# Patient Record
Sex: Male | Born: 1960
Health system: Southern US, Community
[De-identification: ages and names within clinical notes are randomized; demographics above are authoritative.]

## PROBLEM LIST (undated history)

## (undated) DIAGNOSIS — I10 Essential (primary) hypertension: Secondary | ICD-10-CM

## (undated) DIAGNOSIS — IMO0002 Reserved for concepts with insufficient information to code with codable children: Secondary | ICD-10-CM

## (undated) DIAGNOSIS — Z8719 Personal history of other diseases of the digestive system: Secondary | ICD-10-CM

## (undated) DIAGNOSIS — E119 Type 2 diabetes mellitus without complications: Secondary | ICD-10-CM

## (undated) DIAGNOSIS — K5900 Constipation, unspecified: Secondary | ICD-10-CM

## (undated) DIAGNOSIS — M549 Dorsalgia, unspecified: Secondary | ICD-10-CM

## (undated) DIAGNOSIS — J189 Pneumonia, unspecified organism: Secondary | ICD-10-CM

## (undated) DIAGNOSIS — M778 Other enthesopathies, not elsewhere classified: Secondary | ICD-10-CM

## (undated) DIAGNOSIS — M779 Enthesopathy, unspecified: Secondary | ICD-10-CM

## (undated) DIAGNOSIS — E785 Hyperlipidemia, unspecified: Secondary | ICD-10-CM

## (undated) HISTORY — PX: CARPAL TUNNEL RELEASE: SHX101

## (undated) HISTORY — PX: HERNIA REPAIR: SHX51

## (undated) HISTORY — DX: Hyperlipidemia, unspecified: E78.5

## (undated) HISTORY — PX: UMBILICAL HERNIA REPAIR: SHX196

## (undated) HISTORY — PX: COLONOSCOPY: SHX174

---

## 1999-08-30 ENCOUNTER — Encounter: Payer: Self-pay | Admitting: Orthopedic Surgery

## 1999-08-30 ENCOUNTER — Encounter: Admission: RE | Admit: 1999-08-30 | Discharge: 1999-08-30 | Payer: Self-pay | Admitting: Orthopedic Surgery

## 1999-09-03 ENCOUNTER — Ambulatory Visit (HOSPITAL_BASED_OUTPATIENT_CLINIC_OR_DEPARTMENT_OTHER): Admission: RE | Admit: 1999-09-03 | Discharge: 1999-09-03 | Payer: Self-pay | Admitting: Orthopedic Surgery

## 1999-09-17 ENCOUNTER — Ambulatory Visit (HOSPITAL_BASED_OUTPATIENT_CLINIC_OR_DEPARTMENT_OTHER): Admission: RE | Admit: 1999-09-17 | Discharge: 1999-09-17 | Payer: Self-pay | Admitting: Orthopedic Surgery

## 2000-06-05 ENCOUNTER — Encounter: Payer: Self-pay | Admitting: Otolaryngology

## 2000-06-05 ENCOUNTER — Encounter: Admission: RE | Admit: 2000-06-05 | Discharge: 2000-06-05 | Payer: Self-pay | Admitting: Otolaryngology

## 2000-09-19 ENCOUNTER — Encounter: Payer: Self-pay | Admitting: Emergency Medicine

## 2000-09-19 ENCOUNTER — Emergency Department (HOSPITAL_COMMUNITY): Admission: EM | Admit: 2000-09-19 | Discharge: 2000-09-19 | Payer: Self-pay | Admitting: Emergency Medicine

## 2002-10-31 ENCOUNTER — Ambulatory Visit (HOSPITAL_COMMUNITY): Admission: RE | Admit: 2002-10-31 | Discharge: 2002-10-31 | Payer: Self-pay | Admitting: Gastroenterology

## 2003-11-14 ENCOUNTER — Emergency Department (HOSPITAL_COMMUNITY): Admission: EM | Admit: 2003-11-14 | Discharge: 2003-11-14 | Payer: Self-pay | Admitting: Emergency Medicine

## 2009-01-05 ENCOUNTER — Observation Stay (HOSPITAL_COMMUNITY): Admission: EM | Admit: 2009-01-05 | Discharge: 2009-01-07 | Payer: Self-pay | Admitting: Emergency Medicine

## 2010-03-19 ENCOUNTER — Encounter: Admission: RE | Admit: 2010-03-19 | Discharge: 2010-03-19 | Payer: Self-pay | Admitting: Internal Medicine

## 2010-11-11 LAB — LIPID PANEL
Cholesterol: 124 mg/dL (ref 0–200)
LDL Cholesterol: 71 mg/dL (ref 0–99)
Total CHOL/HDL Ratio: 3.3 RATIO
Triglycerides: 73 mg/dL (ref <150)
VLDL: 15 mg/dL (ref 0–40)

## 2010-11-11 LAB — CBC
HCT: 40.8 % (ref 39.0–52.0)
Hemoglobin: 13.7 g/dL (ref 13.0–17.0)
MCHC: 33.4 g/dL (ref 30.0–36.0)
MCHC: 33.7 g/dL (ref 30.0–36.0)
Platelets: 217 10*3/uL (ref 150–400)
RBC: 4.75 MIL/uL (ref 4.22–5.81)
RBC: 4.98 MIL/uL (ref 4.22–5.81)
RDW: 14 % (ref 11.5–15.5)
RDW: 14.4 % (ref 11.5–15.5)

## 2010-11-11 LAB — CK TOTAL AND CKMB (NOT AT ARMC)
CK, MB: 0.8 ng/mL (ref 0.3–4.0)
CK, MB: 0.8 ng/mL (ref 0.3–4.0)
Relative Index: INVALID (ref 0.0–2.5)
Relative Index: INVALID (ref 0.0–2.5)
Total CK: 62 U/L (ref 7–232)
Total CK: 84 U/L (ref 7–232)
Total CK: 99 U/L (ref 7–232)

## 2010-11-11 LAB — HEMOGLOBIN A1C
Hgb A1c MFr Bld: 6.1 % (ref 4.6–6.1)
Mean Plasma Glucose: 128 mg/dL

## 2010-11-11 LAB — BASIC METABOLIC PANEL
BUN: 17 mg/dL (ref 6–23)
CO2: 24 mEq/L (ref 19–32)
CO2: 26 mEq/L (ref 19–32)
Calcium: 9.2 mg/dL (ref 8.4–10.5)
Creatinine, Ser: 1.06 mg/dL (ref 0.4–1.5)
GFR calc Af Amer: >60 mL/min (ref >60)
GFR calc Af Amer: >60 mL/min (ref >60)
Glucose, Bld: 114 mg/dL — ABNORMAL HIGH (ref 70–99)
Potassium: 3.9 mEq/L (ref 3.5–5.1)
Sodium: 135 mEq/L (ref 135–145)

## 2010-11-11 LAB — DIFFERENTIAL
Basophils Absolute: 0 10*3/uL (ref 0.0–0.1)
Basophils Relative: 0 % (ref 0–1)
Eosinophils Absolute: 0 10*3/uL (ref 0.0–0.7)
Eosinophils Relative: 0 % (ref 0–5)
Monocytes Absolute: 0.3 10*3/uL (ref 0.1–1.0)
Neutro Abs: 7.7 10*3/uL (ref 1.7–7.7)

## 2010-11-11 LAB — BRAIN NATRIURETIC PEPTIDE: Pro B Natriuretic peptide (BNP): <30 pg/mL (ref 0.0–100.0)

## 2010-11-11 LAB — POCT CARDIAC MARKERS: Troponin i, poc: <0.05 ng/mL (ref 0.00–0.09)

## 2010-12-17 NOTE — Discharge Summary (Signed)
NAMEHOSIE, Matthew Sawyer              ACCOUNT NO.:  [id number]   MEDICAL RECORD NO.:  [id number]          PATIENT TYPE:  INP   LOCATION:  3705                         FACILITY:  [facility]   PHYSICIAN:  Charlestine Massed, MDDATE OF BIRTH:  [birth year]   DATE OF ADMISSION:  01/05/2009  DATE OF DISCHARGE:  01/07/2009                               DISCHARGE SUMMARY   PRIMARY CARE PHYSICIAN:  Tammy R. Collins Scotland, MD   CARDIOLOGY:  Cristy Hilts. Jacinto Halim, MD   GASTROENTEROLOGY:  Barbette Hair. Arlyce Dice, MD, [facility]   REASON FOR ADMISSION:  Tightness in the chest.   DISCHARGE DIAGNOSES:  1. Chest pain, noncardiac origin, possibly secondary to hiatal hernia.  2. Metabolic syndrome with 3/5 for metabolic syndrome being increased      waist circumference/ obesity, increased blood pressure, increased      fasting blood glucose more than 110.  3. Impaired glucose tolerance  4. Hypertension.  5. Obesity.  6. Hiatal hernia with possible gastroesophageal reflux disease.   DISCHARGE MEDICATIONS:  1. Lotrel 5/20 one tablet p.o. daily.  2. Aspirin 81 mg p.o. daily.  3. Prilosec 20 mg p.o. daily.  4. Metformin 500 mg p.o. b.i.d.   HOSPITAL COURSE:  1. Chest tightness.  The patient was admitted on January 05, 2009 for      complaints of chest tightness.  He said that suddenly around 11:30      p.m. in the night when he was about to sleep he felt a chest      tightness and feeling of indigestion and he was belching much at      that time and he said that there was lot of gas coming through his      upper gastric tract through the mouth.  There was mild pleuritic      component to chest pain and the patient stated that whenever he      took a deep breath he felt some pain also which was increasing.      The pain was more up with the sternal and all over the mediastinal      area with no specific area the patient could point with two fingers      and no fevers, no chills,  no cough, no sputum production, no      nausea,  vomiting, diarrhea.  He is not a smoker, very occasionally      social alcohol intake.  The patient stated that 3 weeks ago he      traveled from [place] to [place] on the flight which is almost 2      hours.  Other than that there is no sedentary lifestyle for him.      He works in the [employer] and he is otherwise in good health.      He goes to gym daily which he recently restarted and he runs on the      treadmill at more than 5 miles an hour for more than half an hour      and has perfectly good exercise tolerance.  No palpitations, no  orthopnea, no  paroxysmal nocturnal dyspnea. The patient was      admitted to telemetry.  His chest x-ray revealed there was a small      shadow in the lingula which possibly could be pneumonia and so he      was started on antibiotic with the admitting physician.  The      patient was examined on the floor.  He does not have any cough,      fever, sputum production or any signs or symptoms of any pneumonia      or bronchitis and there was no sick contacts.  Also in view of the      fact that the patient had a sudden onset chest pain and the shadow      was stated as either pneumonia versus could be a mass and also      pending diagnosis of any possible pulmonary embolism, we decided to      get a CAT scan.  CAT scan of the chest ruled out the presence of      any mass in the chest, it ruled out a pulmonary embolism and it      ruled out pneumonia also, so his antibiotics were discontinued.  He      does not have clinical pneumonia and he does not have pneumonia on      the CT scan also.  He was taken off antibiotics.  The CT scan also      reported presence of hiatal hernia and some secondary thickening of      the lower border of the esophagus.  This was in the fact that the      patient may be having chronic gastroesophageal reflux due to hiatal      hernia and this needs a Gastroenterology evaluation as outpatient      to rule out a  Barrett's or any other features in the esophagus.      This could have contributed for his chest tightness which he      experienced.  The chest tightness even though it was present for      more than 3-4 hours did not result in significant EKG changes.  The      EKG was showing right bundle branch block with no evidence of any      new acute ST or T-wave changes.  The patient could have had thi      right bundle branch block for a long time.  In view of the current      exam that ruled him out for PE also which is also negative. The      patient has been advised to continue exercise.  He has been      diagnosed with metabolic syndrome on this admission.  He will be      seen Dr. Arlyce Dice of [facility] as an outpatient for      EGD to evaluate his gastroesophageal reflux and hiatal hernia.  He      will also see Dr. Jacinto Halim at [facility] as an      outpatient evaluation and echocardiogram and if needed any stress      test at the discretion of Dr. Jacinto Halim.  The patient is being      discharged today.  Telemetry did not reveal any abnormal arrhythmic      events.  2. Metabolic syndrome.  The patient has increased waist circumference.  He is obese.  His elevated fasting blood glucose is 114 and he has      hypertension which fits into a diagnosis of metabolic syndrome.      The patient has been advised diet and exercise.  We have started      him on metformin in view of the fact that he may be in the process      of helping diabetes and the metformin can help him with his insulin      resistance in this case.  He will be seeing his primary doctor in      about 1-2 weeks and needs a further checkup of hemoglobin A1c in      another 3 months.  His current hemoglobin A1c is 6.1.  3. His hypertension currently is controlled.  We will continue Lotrel      5/20 one tablet daily.  4. Hiatal hernia/gastroesophageal reflux.  We will continue Prilosec      20 mg p.o.  daily and to see Dr. Arlyce Dice as an outpatient.   DISPOSITION:  Discharged back home.   FOLLOWUP:  1. Follow up with Dr. Collins Scotland in 2-3 weeks.  2. Follow up or call [facility] at [phone] for      appointment with Dr. Jacinto Halim for echocardiogram and for possible      stress test and consult.  3. Call [facility] [phone] to see Dr. Arlyce Dice for      evaluation of hiatal hernia and GERD.  Possibly EGD needed at that      time.   DISCHARGE INSTRUCTIONS:  1. Keep up with regular exercise and diet control and lose weight.      Follow a low-sodium, heart-healthy and diabetic diet.  2. Adhere to the appointment as mentioned.  3. A total of 45 minutes was spent on discharge and educating the      patient about his risks and explaining the appointments and      procedures.      Charlestine Massed, MD  Electronically Signed     UT/MEDQ  D:  01/07/2009  T:  01/08/2009  Job:  [id number]   cc:   Barbette Hair. Arlyce Dice, MD,FACG  Cristy Hilts. Jacinto Halim, MD  Tammy R. Collins Scotland, M.D.

## 2010-12-17 NOTE — H&P (Signed)
Matthew Sawyer, Matthew Sawyer              ACCOUNT NO.:  [id number]   MEDICAL RECORD NO.:  [id number]          PATIENT TYPE:  INP   LOCATION:  1824                         FACILITY:  [facility]   PHYSICIAN:  Richarda Overlie, MD       DATE OF BIRTH:  06/11/1961   DATE OF ADMISSION:  01/05/2009  DATE OF DISCHARGE:                              HISTORY & PHYSICAL   PRIMARY CARE PHYSICIAN:  Dr. Collins Scotland.   SUBJECTIVE:  This is a 50 year old male who presents to the ER with the  chief complaint of chest tightness and feeling of indigestion that  started around 11:30 last night.  It continued until 2:30.  The patient  states that he found relief after taking 4 baby aspirin.  The patient's  chest tightness is not particularly related to exertion.  However, it  was present when the patient takes a deep breath, and is pleuritc in  nature .  He denies any cough, fevers, chills and rigors that he knows  of.  Denies any sick contacts.  He is a nonsmoker.  He denies any  shortness of breath, palpitations, dizziness, orthopnea, paroxysmal  nocturnal dyspnea, or peripheral edema.  The patient does not have any  history of cardiac disease although he has history of long-standing  history and borderline diabetes.  He has been otherwise in fairly good  health.   PAST MEDICAL HISTORY:  1. Hypertension.  2. Borderline diabetes.   PAST SURGICAL HISTORY:  None.   SOCIAL HISTORY:  Nonsmoker.  Drinks socially.  Works in the [employer].   FAMILY HISTORY:  Mother has congestive heart failure.  Father died of a  massive heart attack in his late 17s.   ALLERGIES:  No known drug allergies.   MEDICATIONS:  Lotrel 5/20 one tablet p.o. daily.   PHYSICAL EXAMINATION:  VITAL SIGNS:  Blood pressure 131/70, pulse 107,  respirations 18, temperature 98.5.  GENERAL:  The patient appears to be currently comfortable in no acute  distress, well developed, well nourished.  HEENT:  Pupils equal and reactive.  Extraocular movements  intact.  CARDIOVASCULAR:  Regular rate and rhythm.  No appreciable murmurs, rubs  or gallops.  ABDOMEN:  Soft, nontender, nondistended.  EXTREMITIES:  Without clubbing, cyanosis, or edema.  NEUROLOGICAL:  Cranial nerves II-XII grossly intact.  LUNGS:  Bibasilar rhonchi and occasional wheezing at the bases with  crackles at the left base  PSYCHIATRIC:  Appropriate mood and affect.   LABORATORY DATA:  Sinus tachycardia with left axis deviation and long QT  interval.   Other labs:  Chest x-ray showed streaky left lingular opacity, otherwise  no acute cardiopulmonary disease.  Troponin less than 0.02, CK-MB 1.6.  BMET:  Sodium 130, potassium 3.9, chloride 96, bicarb 24, glucose 185,  BUN 17, creatinine 1.06.  CBC:  WBC is 8.4, hemoglobin 14.4, hematocrit  43.2, platelet count 217.   ASSESSMENT:  1. Chest tightness, probably secondary to pneumonia.  2. Hyponatremia secondary to syndrome of inappropriate antidiuretic      hormone secretion.  3. Mild renal insufficiency.  4. Sinus tachycardia secondary to underlying infection.  5. Hypertension.   PLAN:  Patient will be admitted to a telemetry unit with serial  troponins and EKGs to be ruled out for acute coronary syndrome.  He has  a negative D-dimer.  His chest x-ray is positive for a left lingular  opacity which could be a pneumonia.  The patient will be started  empirically on ceftriaxone, azithromycin, breathing treatments with  Atrovent and Pulmicort nebulizers.  Will run cardiac enzymes, BNP,  fasting lipid panel, hemoglobin A1c for cardiac risk stratification.  The D-dimer was done and was found to be negative; therefore, low  suspicion for pulmonary embolism.  Most likely, this patient has a  pneumonia.  The patient will be started on normal saline for IV  hydration.  Hyponatremia could be related to dehydration which is also  suggested by the patient's sinus tachycardia.  Once the patient is  hydrated and the patient is  symptomatically improved, the patient can be  discharged in the morning.      Richarda Overlie, MD  Electronically Signed     NA/MEDQ  D:  01/05/2009  T:  01/05/2009  Job:  [id number]

## 2010-12-20 NOTE — Op Note (Signed)
Matthew Sawyer, Matthew Sawyer                          ACCOUNT NO.:  [id number]   MEDICAL RECORD NO.:  [id number]                   PATIENT TYPE:  AMB   LOCATION:  ENDO                                 FACILITY:  [facility]   PHYSICIAN:  Anselmo Rod, M.D.               DATE OF BIRTH:  17-Apr-1961   DATE OF PROCEDURE:  10/31/2002  DATE OF DISCHARGE:                                 OPERATIVE REPORT   PROCEDURE PERFORMED:  Colonoscopy.   ENDOSCOPIST:  Charna Elizabeth, M.D.   INSTRUMENT USED:  Olympus video colonoscope.   INDICATIONS FOR PROCEDURE:  Rectal bleeding in a 50 year old [place]-  American male.  Rule out colonic polyps, masses, hemorrhoids, etc.   PREPROCEDURE PREPARATION:  Informed consent was procured from the patient.  The patient was fasted for eight hours prior to the procedure and prepped  with a bottle of magnesium citrate and a gallon of GoLYTELY the night prior  to the procedure.   PREPROCEDURE PHYSICAL:  The patient had stable vital signs.  Neck supple.  Chest clear to auscultation.  S1 and S2 regular.  Abdomen soft with normal  bowel sounds.   DESCRIPTION OF PROCEDURE:  The patient was placed in left lateral decubitus  position and sedated with 75 mg of Demerol and 7.5 mg of Versed  intravenously.  Once the patient was adequately sedated and maintained on  low flow oxygen and continuous cardiac monitoring, the Olympus video  colonoscope was advanced from the rectum to the cecum with difficulty.  There was a large amount of residual stool in the colon.  Multiple washes  were done.  The patient's position was changed from the left lateral to the  supine and right lateral position to reach the cecal base.  The appendicular  orifice and ileocecal valve were visualized and photographed.  No masses,  polyps, erosions, ulcerations or diverticula were seen.  Retroflexion in the  rectum revealed prominent internal hemorrhoids.  The patient tolerated the  procedure well without  complications.  Considering the significant amount of  residual stool in the colon, small lesions could have been missed.   IMPRESSION:  1. A large amount of residual stool in the colon, small lesions could have     been missed.  2. Prominent internal hemorrhoids seen on retroflexion.              RECOMMENDATIONS:  1. A high fiber diet with liberal fluid intake has been advocated.  2. Steroid suppositories will be tried if the patient's symptoms persisted.  3. Repeat colorectal cancer screening as recommended at age 18 unless the     patient develops any abnormal symptoms in the interim.  Anselmo Rod, M.D.    JNM/MEDQ  D:  10/31/2002  T:  10/31/2002  Job:  [id number]   cc:   Candyce Churn. Allyne Gee, M.D.  [address]  [address]  [place]  [place] [address]  Fax: [phone]

## 2010-12-20 NOTE — Op Note (Signed)
[place]  Patient:    [name], [name]                       MRN: [id number] Proc. Date: 09/17/99 Adm. Date:  [id number] Attending:  Sypher, Douglass Rivers CC:         Katy Fitch. Sypher, Montez Hageman., M.D. x 2                           Operative Report  PREOPERATIVE DIAGNOSIS:  Entrapment neuropathy median nerve left carpal tunnel.  POSTOPERATIVE DIAGNOSIS:  Entrapment neuropathy median nerve left carpal tunnel.  PROCEDURE:  Release of left transverse carpal ligament.  SURGEON:  Katy Fitch. Sypher, Montez Hageman., M.D.  ASSISTANT:  Matthew Sawyer, P.A.  ANESTHESIA:  General LMA.  SUPERVISING ANESTHESIOLOGIST:  Dr. Katrinka Blazing.  INDICATIONS:  [name] is a 50 year old employee of the [employer]. Since 1994, he has experienced discomfort in his hands consistent with entrapment neuropathy of the median nerves.  He has had electrodiagnostic studies that confirms severe bilateral carpal tunnel syndrome.  He is status post right carpal tunnel release.  He requests that an identical left carpal tunnel release be performed at this time.  DESCRIPTION OF PROCEDURE:  [name] was brought to the operating room and  placed supine position on the operating table.  Following induction of general anesthesia by LMA, the arm was prepped with Betadine soap solution and sterilely draped.  Following exsanguination of limb with Esmarch bandage, arterial tourniquet was inflated to 225 mmHg.  The procedure commenced with a short incision in the line of the ring finger in the palm.  Subcutaneous tissues were carefully divided revealing the palmar fascia.  This was split longitudinally to reveal the common sensory branch of the median nerve.  These were followed back to the transverse carpal ligament, which was carefully isolated from the median nerve.  The ligament was  released on its ulnar border extending into the distal forearm.  This  widely opened the carpal canal.  No masses or other predicaments were noted.   Bleeding points along the margin of the released ligament were electrocauterized with bipolar current.  The wound is then repaired with intradermal 3-0 Prolene suture.  A compressive dressing was applied with a volar plaster splint maintaining the wrist in 5 degrees of dorsiflexion. DD:  09/17/99 TD:  09/17/99 Job: [id number]

## 2010-12-20 NOTE — Op Note (Signed)
[place]  Patient:    DONELL TOMKINS                        MRN: [id number] Proc. Date: 09/03/99 Adm. Date:  [id number] Attending:  Sypher, Douglass Rivers CC:         Katy Fitch. Sypher, Montez Hageman., M.D. (2)                           Operative Report  PREOPERATIVE DIAGNOSIS:  Entrapment neuropathy, median nerve, right carpal tunnel.  POSTOPERATIVE DIAGNOSIS:  Entrapment neuropathy, median nerve, right carpal tunnel.  OPERATION PERFORMED:  Release of right transverse carpal ligament.  OPERATING SURGEON:  Josephine Igo, M.D.  ASSISTANT:  Gigi Gin, P.A.  ANESTHESIA:  General.  ANESTHESIOLOGIST:  Dr. Gelene Mink.  INDICATIONS:  Trevyn Lumpkin is a 50 year old man employed by the [employer].  He has had a four to five-year history of carpal tunnel syndrome symptoms.  He recently reached a point where he could no longer stand the discomfort.  Electrodiagnostic studies revealed profound median neuropathy of the wrist.  Due to failure of nonoperative measures, the patient is brought to the operating room at this time for release of his right transverse carpal ligament.  DESCRIPTION OF PROCEDURE:  Aras Albarran was brought to the operating room and  placed in supine position on the operating table.  Following induction of general anesthesia by LMA, the right arm was prepped with Betadine soap and solution and sterilely draped.  Following exsanguination of the limb with an Esmarch bandage, the arterial tourniquet was inflated to 240 mmHg.  The procedure commenced with a short incision in line of the ring finger in the palm.  The subcutaneous tissues were carefully divided revealing the palmar fascia.  This was split longitudinally to reveal the common sensory branch of the median nerve.  These were followed back to the transverse carpal ligament which was carefully isolated from the median nerve. The ligament was released on  its ulnar border extending into the distal forearm. his widely opened the carpal canal.  The tenosynovium of the ulnar bursa was noted o be markedly thickened.  No masses or other predicaments were noted.  The wound margin was inspected for bleeding and subsequently repaired with intradermal 3-0 Prolene suture.  A compressive dressing was applied with Xeroflo, sterile gauze, sterile Webril nd a volar plaster splint maintaining the wrist in 5 degrees dorsiflexion.  There ere no apparent complications. DD:  09/03/99 TD:  09/03/99 Job: [id number]

## 2011-03-08 ENCOUNTER — Encounter: Payer: Self-pay | Admitting: *Deleted

## 2011-03-08 ENCOUNTER — Emergency Department (HOSPITAL_BASED_OUTPATIENT_CLINIC_OR_DEPARTMENT_OTHER)
Admission: EM | Admit: 2011-03-08 | Discharge: 2011-03-08 | Disposition: A | Discharge status: Home / Self Care | Payer: Federal, State, Local not specified - PPO | Attending: Emergency Medicine | Admitting: Emergency Medicine

## 2011-03-08 DIAGNOSIS — M549 Dorsalgia, unspecified: Secondary | ICD-10-CM | POA: Insufficient documentation

## 2011-03-08 DIAGNOSIS — M5430 Sciatica, unspecified side: Secondary | ICD-10-CM | POA: Insufficient documentation

## 2011-03-08 HISTORY — DX: Other enthesopathies, not elsewhere classified: M77.8

## 2011-03-08 HISTORY — DX: Dorsalgia, unspecified: M54.9

## 2011-03-08 HISTORY — DX: Enthesopathy, unspecified: M77.9

## 2011-03-08 MED ORDER — PREDNISONE 20 MG PO TABS: 60.0000 mg | ORAL_TABLET | Freq: Every day | ORAL | Status: DC

## 2011-03-08 MED ORDER — OXYCODONE-ACETAMINOPHEN 5-325 MG PO TABS: 1.0000 | ORAL_TABLET | Freq: Four times a day (QID) | ORAL | Status: AC | PRN

## 2011-03-08 NOTE — ED Notes (Signed)
Patient c/o lower back pain over the past year & is seen by an orthopedic MD, hurts more this WE than usual

## 2011-03-08 NOTE — ED Provider Notes (Signed)
History     CSN: [id number] Arrival date & time: 03/08/2011  7:31 AM  Chief Complaint  Patient presents with  . Back Pain   Patient is a 50 y.o. male presenting with back pain. The history is provided by the patient.  Back Pain  This is a chronic problem. The problem occurs constantly. The problem has been gradually worsening. The pain is associated with no known injury.  Pt with known lumbar disk disease and small right foraminal protrusion encroach on the exiting right L4 root based on MRI done 04/2010 has had worsening pain in lower back for the last several days, radiating into his R leg. Worse with movement. He has been seeing Dr. Shon Baton with [facility] and is scheduled to begin cortisone injections with Dr. Ethelene Hal soon. No problems with bowel or bladder function.    No past medical history on file.  No past surgical history on file.  No family history on file.  History  Substance Use Topics  . Smoking status: Not on file  . Smokeless tobacco: Not on file  . Alcohol Use: Not on file      Review of Systems  Musculoskeletal: Positive for back pain.  All other systems reviewed and are negative.    Physical Exam  BP 128/82  Pulse 86  Temp(Src) 98.5 F (36.9 C) (Oral)  Resp 18  SpO2 99%  Physical Exam  Nursing note and vitals reviewed. Constitutional: He is oriented to person, place, and time. He appears well-developed and well-nourished.  HENT:  Head: Normocephalic and atraumatic.  Eyes: EOM are normal. Pupils are equal, round, and reactive to light.  Neck: Normal range of motion. Neck supple.  Cardiovascular: Normal rate, normal heart sounds and intact distal pulses.   Pulmonary/Chest: Effort normal and breath sounds normal.  Abdominal: Bowel sounds are normal. He exhibits no distension. There is no tenderness.  Musculoskeletal: Normal range of motion. He exhibits no edema.       Mild tenderness in R lumbar paraspinal muscles, no bony tenderness  Neurological: He  is alert and oriented to person, place, and time. No cranial nerve deficit.       R patellar reflex is diminished, strength and sensation are normal. Otherwise normal neuro exam.   Skin: Skin is warm and dry. No rash noted.  Psychiatric: He has a normal mood and affect.    ED Course  Procedures  MDM Will d/c with pain meds, steroids and continued care of his sciatica by Drs. Brooks and Ramos at [facility].       Charles B. Bernette Mayers, MD 03/08/11 [phone]

## 2011-05-04 ENCOUNTER — Encounter (HOSPITAL_BASED_OUTPATIENT_CLINIC_OR_DEPARTMENT_OTHER): Payer: Self-pay | Admitting: Emergency Medicine

## 2011-05-04 ENCOUNTER — Emergency Department (INDEPENDENT_AMBULATORY_CARE_PROVIDER_SITE_OTHER): Payer: Federal, State, Local not specified - PPO

## 2011-05-04 ENCOUNTER — Emergency Department (HOSPITAL_BASED_OUTPATIENT_CLINIC_OR_DEPARTMENT_OTHER)
Admission: EM | Admit: 2011-05-04 | Discharge: 2011-05-04 | Disposition: A | Discharge status: Home / Self Care | Payer: Federal, State, Local not specified - PPO | Attending: Emergency Medicine | Admitting: Emergency Medicine

## 2011-05-04 DIAGNOSIS — J45909 Unspecified asthma, uncomplicated: Secondary | ICD-10-CM | POA: Insufficient documentation

## 2011-05-04 DIAGNOSIS — I10 Essential (primary) hypertension: Secondary | ICD-10-CM | POA: Insufficient documentation

## 2011-05-04 DIAGNOSIS — Y9229 Other specified public building as the place of occurrence of the external cause: Secondary | ICD-10-CM | POA: Insufficient documentation

## 2011-05-04 DIAGNOSIS — S93609A Unspecified sprain of unspecified foot, initial encounter: Secondary | ICD-10-CM | POA: Insufficient documentation

## 2011-05-04 DIAGNOSIS — M79609 Pain in unspecified limb: Secondary | ICD-10-CM

## 2011-05-04 DIAGNOSIS — M2010 Hallux valgus (acquired), unspecified foot: Secondary | ICD-10-CM

## 2011-05-04 DIAGNOSIS — S93602A Unspecified sprain of left foot, initial encounter: Secondary | ICD-10-CM

## 2011-05-04 DIAGNOSIS — X58XXXA Exposure to other specified factors, initial encounter: Secondary | ICD-10-CM | POA: Insufficient documentation

## 2011-05-04 HISTORY — DX: Essential (primary) hypertension: I10

## 2011-05-04 HISTORY — DX: Reserved for concepts with insufficient information to code with codable children: IMO0002

## 2011-05-04 MED ORDER — IBUPROFEN 800 MG PO TABS
800.0000 mg | ORAL_TABLET | Freq: Once | ORAL | Status: AC
  Administered 2011-05-04: 800 mg via ORAL
  Filled 2011-05-04: qty 1

## 2011-05-04 MED ORDER — OXYCODONE-ACETAMINOPHEN 5-325 MG PO TABS: 1.0000 | ORAL_TABLET | Freq: Four times a day (QID) | ORAL | Status: AC | PRN

## 2011-05-04 NOTE — ED Notes (Signed)
Care plan and use of meds reviewed with pt verbalizes plan well

## 2011-05-04 NOTE — ED Notes (Signed)
Pt reports pain to top of L foot with increased severity x 24hrs

## 2011-05-04 NOTE — ED Provider Notes (Signed)
History     CSN: [id number] Arrival date & time: 05/04/2011 11:37 AM Pt seen at 1142am Chief Complaint  Patient presents with  . Foot Pain    severe foot pain x 24hrs denies trauma    (Consider location/radiation/quality/duration/timing/severity/associated sxs/prior treatment) Patient is a 50 y.o. male presenting with lower extremity pain. The history is provided by the patient.  Foot Pain This is a recurrent problem. Episode onset: several months ago. The problem occurs daily. The problem has been gradually worsening. The symptoms are aggravated by walking. The symptoms are relieved by rest.  pt reports long h/o chronic pain in his left foot About two days ago he was at church, stepped off chair onto left foot and his pain worsened He did not fall or twist foot/ankle Reports it hurts to walk He thinks he has gout   Past Medical History  Diagnosis Date  . Back pain   . Capsulitis of foot   . Asthma   . Hypertension   . Prolapsed intervertebral disk     Past Surgical History  Procedure Date  . Hernia repair   . Carpal tunnel release     History reviewed. No pertinent family history.  History  Substance Use Topics  . Smoking status: Never Smoker   . Smokeless tobacco: Not on file  . Alcohol Use: Yes      Review of Systems  Constitutional: Negative for fever.  Musculoskeletal: Negative for joint swelling.    Allergies  Review of patient's allergies indicates no known allergies.  Home Medications   Current Outpatient Rx  Name Route Sig Dispense Refill  . AMLODIPINE BESYLATE 10 MG PO TABS Oral Take 10 mg by mouth daily.      . OXYCODONE-ACETAMINOPHEN 5-300 MG PO TABS Oral Take 1 tablet by mouth every 4 (four) hours as needed.      . OXYCODONE-ACETAMINOPHEN 5-325 MG PO TABS Oral Take 1 tablet by mouth every 6 (six) hours as needed for pain. 10 tablet 0  . THEOPHYLLINE PO Oral Take by mouth.        BP 120/72  Pulse 78  Temp(Src) 99 F (37.2 C) (Oral)   Resp 20  SpO2 99%  Physical Exam CONSTITUTIONAL: Well developed/well nourished HEAD AND FACE: Normocephalic/atraumatic EYES: EOMI/PERRL ENMT: Mucous membranes moist NECK: supple no meningeal signs ABDOMEN: soft,  NEURO: Pt is awake/alert, moves all extremitiesx4 EXTREMITIES: pulses normal, full ROM, no erythema/induration noted to dorsum of left foot Chronic hallux valgus deformity noted the left foot, mild tenderness to dorsum of left foot, there is no malleoli tenderness to left foot/ankle, there is no tenderness over left fifth metatarsal SKIN: warm, color normal PSYCH: no abnormalities of mood noted  ED Course  Procedures (including critical care time)  Labs Reviewed - No data to display Dg Foot Complete Left  05/04/2011  *RADIOLOGY REPORT*  Clinical Data: Left anterior foot pain  LEFT FOOT - COMPLETE 3+ VIEW  Comparison: None.  Findings: Three views of the left foot submitted.  No acute fracture or subluxation.  There is hallux valgus deformity.  Mild erosive degenerative changes noted distal first metatarsal.  No acute fracture or subluxation.  IMPRESSION: No acute fracture or subluxation.  Hallux valgus deformity.  Mild degenerative changes distal first metatarsal.  Original Report Authenticated By: Natasha Mead, M.D.     1. Sprain of left foot       MDM  Nursing notes reviewed and considered in documentation xrays reviewed and considered - no fracture  Offered postop boot pain control and referral to orthopedic physician         Joya Gaskins, MD 05/04/11 1232

## 2011-05-04 NOTE — ED Notes (Signed)
Pt ambulatory with post op shoe steady gait

## 2011-12-20 ENCOUNTER — Encounter (HOSPITAL_COMMUNITY): Payer: Self-pay | Admitting: *Deleted

## 2011-12-20 ENCOUNTER — Emergency Department (HOSPITAL_COMMUNITY)
Admission: EM | Admit: 2011-12-20 | Discharge: 2011-12-21 | Disposition: A | Discharge status: Home / Self Care | Payer: Federal, State, Local not specified - PPO | Attending: Emergency Medicine | Admitting: Emergency Medicine

## 2011-12-20 DIAGNOSIS — J45909 Unspecified asthma, uncomplicated: Secondary | ICD-10-CM | POA: Insufficient documentation

## 2011-12-20 DIAGNOSIS — M549 Dorsalgia, unspecified: Secondary | ICD-10-CM | POA: Insufficient documentation

## 2011-12-20 DIAGNOSIS — I10 Essential (primary) hypertension: Secondary | ICD-10-CM | POA: Insufficient documentation

## 2011-12-20 NOTE — ED Notes (Signed)
Pt alert, c/o low back pain, onset this evening, hx of back problems, denies recent trauma or injury

## 2011-12-21 MED ORDER — OXYCODONE-ACETAMINOPHEN 5-325 MG PO TABS: 1.0000 | ORAL_TABLET | Freq: Four times a day (QID) | ORAL | Status: AC | PRN

## 2011-12-21 MED ORDER — HYDROMORPHONE HCL PF 1 MG/ML IJ SOLN
1.0000 mg | Freq: Once | INTRAMUSCULAR | Status: DC
  Filled 2011-12-21: qty 1

## 2011-12-21 MED ORDER — ONDANSETRON 4 MG PO TBDP
4.0000 mg | ORAL_TABLET | Freq: Once | ORAL | Status: AC
  Administered 2011-12-21: 4 mg via ORAL
  Filled 2011-12-21: qty 1

## 2011-12-21 MED ORDER — HYDROMORPHONE HCL PF 1 MG/ML IJ SOLN
1.0000 mg | Freq: Once | INTRAMUSCULAR | Status: AC
  Administered 2011-12-21: 1 mg via INTRAMUSCULAR

## 2011-12-21 NOTE — ED Notes (Signed)
Pt c/o low back pain, pt states this is old problem, recent flair. Pt slouching in chair in position of comfort.

## 2011-12-21 NOTE — ED Provider Notes (Signed)
History     CSN: [id number]  Arrival date & time 12/20/11  2315   First MD Initiated Contact with Patient 12/20/11 2358      Chief Complaint  Patient presents with  . Back Pain    (Consider location/radiation/quality/duration/timing/severity/associated sxs/prior treatment) HPI  Patient presents to the ED with complaints of back pain. He says that he has chronic back pain and has been referred from [place] to [facility]. He says that 1-3 times a week gets this severe pain and is unable to walk due to pain. He says that due to being in between practices he does not have any Percocets left. He has  a left foot drop which is chronic and has been persistent for 6 months.  He denies having any new symptoms. He denies numbness to his right leg but is having tingling. He denies bowel or urinary incontinence. 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 Pt is in no acute distress  But does appear very uncomfortable. Vitals signs are stable.   Past Medical History  Diagnosis Date  . Back pain   . Capsulitis of foot   . Asthma   . Hypertension   . Prolapsed intervertebral disk     Past Surgical History  Procedure Date  . Hernia repair   . Carpal tunnel release     History reviewed. No pertinent family history.  History  Substance Use Topics  . Smoking status: Never Smoker   . Smokeless tobacco: Not on file  . Alcohol Use: Yes      Review of Systems   HEENT: denies blurry vision or change in hearing PULMONARY: Denies difficulty breathing and SOB CARDIAC: denies chest pain or heart palpitations MUSCULOSKELETAL:  denies being unable to feel his legs ABDOMEN AL: denies abdominal pain GU: denies loss of bowel or urinary control NEURO: denies numbness and tingling in extremities   Allergies  Review of patient's allergies indicates no known allergies.  Home Medications    Current Outpatient Rx  Name Route Sig Dispense Refill  . ALBUTEROL SULFATE HFA 108 (90 BASE) MCG/ACT IN AERS Inhalation Inhale 2 puffs into the lungs every 6 (six) hours as needed.    Marland Kitchen AMLODIPINE BESYLATE 10 MG PO TABS Oral Take 10 mg by mouth daily.      . THEOPHYLLINE ER 100 MG PO TB12 Oral Take 100 mg by mouth daily.    . OXYCODONE-ACETAMINOPHEN 5-325 MG PO TABS Oral Take 1 tablet by mouth every 6 (six) hours as needed for pain. 10 tablet 0    BP 122/83  Pulse 96  Temp(Src) 97.8 F (36.6 C) (Oral)  Resp 18  SpO2 98%  Physical Exam  Nursing note and vitals reviewed. Constitutional: He appears well-developed and well-nourished. No distress.  HENT:  Head: Normocephalic and atraumatic.  Eyes: Pupils are equal, round, and reactive to light.  Neck: Normal range of motion. Neck supple.  Cardiovascular: Normal rate and regular rhythm.   Pulmonary/Chest: Effort normal.  Abdominal: Soft.  Musculoskeletal:        Equal strength to bilateral lower extremities. Neurosensory  function adequate to both legs. Skin color is normal. Skin is warm and moist. I see no step off deformity, no bony tenderness. Pt is able to ambulate without limp. Pain is relieved when sitting in certain positions. ROM is decreased due to pain. No  crepitus, laceration, effusion, swelling.  Pulses are normal   Neurological: He is alert.  Skin: Skin is warm and dry.    ED Course  Procedures (including critical care time)  Labs Reviewed - No data to display No results found.   1. Back pain       MDM  Patient with back pain. No neurological deficits. Patient is ambulatory. No warning symptoms of back pain including: loss of bowel or bladder control, night sweats, waking from sleep with back pain, unexplained fevers or weight loss, h/o cancer, IVDU, recent trauma. No concern for cauda equina, epidural abscess, or other serious cause of back pain. Conservative measures such as rest, ice/heat and pain medicine  indicated with PCP follow-up if no improvement with conservative management.    Patient is waiting for [facility] to get his file to have appointment scheduled.   Pt has been advised of the symptoms that warrant their return to the ED. Patient has voiced understanding and has agreed to follow-up with the PCP or specialist.       Dorthula Matas, PA 12/21/11 0202

## 2011-12-21 NOTE — ED Provider Notes (Signed)
Medical screening examination/treatment/procedure(s) were performed by non-physician practitioner and as supervising physician I was immediately available for consultation/collaboration.   Madalee Altmann A Kaedence Connelly, MD 12/21/11 0734 

## 2011-12-21 NOTE — Discharge Instructions (Signed)
Back Pain, Adult °Low back pain is very common. About 1 in 5 people have back pain. The cause of low back pain is rarely dangerous. The pain often gets better over time. About half of people with a sudden onset of back pain feel better in just 2 weeks. About 8 in 10 people feel better by 6 weeks.  °CAUSES °Some common causes of back pain include: °· Strain of the muscles or ligaments supporting the spine.  °· Wear and tear (degeneration) of the spinal discs.  °· Arthritis.  °· Direct injury to the back.  °DIAGNOSIS °Most of the time, the direct cause of low back pain is not known. However, back pain can be treated effectively even when the exact cause of the pain is unknown. Answering your caregiver's questions about your overall health and symptoms is one of the most accurate ways to make sure the cause of your pain is not dangerous. If your caregiver needs more information, he or she may order lab work or imaging tests (X-rays or MRIs). However, even if imaging tests show changes in your back, this usually does not require surgery. °HOME CARE INSTRUCTIONS °For many people, back pain returns. Since low back pain is rarely dangerous, it is often a condition that people can learn to manage on their own.  °· Remain active. It is stressful on the back to sit or stand in one place. Do not sit, drive, or stand in one place for more than 30 minutes at a time. Take short walks on level surfaces as soon as pain allows. Try to increase the length of time you walk each day.  °· Do not stay in bed. Resting more than 1 or 2 days can delay your recovery.  °· Do not avoid exercise or work. Your body is made to move. It is not dangerous to be active, even though your back may hurt. Your back will likely heal faster if you return to being active before your pain is gone.  °· Pay attention to your body when you  bend and lift. Many people have less discomfort when lifting if they bend their knees, keep the load close to their  bodies, and avoid twisting. Often, the most comfortable positions are those that put less stress on your recovering back.  °· Find a comfortable position to sleep. Use a firm mattress and lie on your side with your knees slightly bent. If you lie on your back, put a pillow under your knees.  °· Only take over-the-counter or prescription medicines as directed by your caregiver. Over-the-counter medicines to reduce pain and inflammation are often the most helpful. Your caregiver may prescribe muscle relaxant drugs. These medicines help dull your pain so you can more quickly return to your normal activities and healthy exercise.  °· Put ice on the injured area.  °· Put ice in a plastic bag.  °· Place a towel between your skin and the bag.  °· Leave the ice on for 15 to 20 minutes, 3 to 4 times a day for the first 2 to 3 days. After that, ice and heat may be alternated to reduce pain and spasms.  °· Ask your caregiver about trying back exercises and gentle massage. This may be of some benefit.  °· Avoid feeling anxious or stressed. Stress increases muscle tension and can worsen back pain. It is important to recognize when you are anxious or stressed and learn ways to manage it. Exercise is a great option.  °SEEK MEDICAL CARE IF: °· You have pain that is not relieved with rest or medicine.  °· You have   pain that does not improve in 1 week.  °· You have new symptoms.  °· You are generally not feeling well.  °SEEK IMMEDIATE MEDICAL CARE IF:  °· You have pain that radiates from your back into your legs.  °· You develop new bowel or bladder control problems.  °· You have unusual weakness or numbness in your arms or legs.  °· You develop nausea or vomiting.  °· You develop abdominal pain.  °· You feel faint.  °Document Released: 07/21/2005 Document Revised: 07/10/2011 Document Reviewed: 12/09/2010 °[employer]® Patient Information ©2012 [employer].Chronic Back Pain °When back pain lasts longer than 3 months, it is called  chronic back pain. This pain can be frustrating, but the cause of the pain is rarely dangerous. People with chronic back pain often go through certain periods that are more intense (flare-ups). °CAUSES °Chronic back pain can be caused by wear and tear (degeneration) on different structures in your back. These structures may include bones, ligaments, or discs. This degeneration may result in more pressure being placed on the nerves that travel to your legs and feet. This can lead to pain traveling from the low back down the back of the legs. When pain lasts longer than 3 months, it is not unusual for people to experience anxiety or depression. Anxiety and depression can also contribute to low back pain. °TREATMENT °· Establish a regular exercise plan. This is critical to improving your functional level.  °· Have a self-management plan for when you flare-up. Flare-ups rarely require a medical visit. Regular exercise will help reduce the intensity and frequency of your flare-ups.  °· Manage how you feel about your back pain and the rest of your life. Anxiety, depression, and feeling that you cannot alter your back pain have been shown to make back pain more intense and debilitating.  °· Medicines should never be your only treatment. They should be used along with other treatments to help you return to a more active lifestyle.  °· Procedures such as injections or surgery may be helpful but are rarely necessary. You may be able to get the same results with physical therapy or chiropractic care.  °HOME CARE INSTRUCTIONS °· Avoid bending, heavy lifting, prolonged sitting, and activities which make the problem worse.  °· Continue normal activity as much as possible.  °· Take brief periods of rest throughout the day to reduce your pain during flare-ups.  °· Follow your back exercise rehabilitation program. This can help reduce symptoms and prevent more pain.  °· Only take over-the-counter or prescription medicines as  directed by your caregiver. Muscle relaxants are sometimes prescribed. Narcotic pain medicine is discouraged for long-term pain, since addiction is a possible outcome.  °· If you smoke, quit.  °· Eat healthy foods and maintain a recommended body weight.  °SEEK IMMEDIATE MEDICAL CARE IF:  °· You have weakness or numbness in one of your legs or feet.  °· You have trouble controlling your bladder or bowels.  °· You develop nausea, vomiting, abdominal pain, shortness of breath, or fainting.  °Document Released: 08/28/2004 Document Revised: 07/10/2011 Document Reviewed: 07/05/2011 °Exit[employer]® Patient Information ©2012 [employer]. °

## 2013-10-03 ENCOUNTER — Encounter (HOSPITAL_COMMUNITY): Payer: Self-pay | Admitting: Emergency Medicine

## 2013-10-03 ENCOUNTER — Emergency Department (HOSPITAL_COMMUNITY)
Admission: EM | Admit: 2013-10-03 | Discharge: 2013-10-04 | Disposition: A | Discharge status: Home / Self Care | Payer: Federal, State, Local not specified - PPO | Attending: Emergency Medicine | Admitting: Emergency Medicine

## 2013-10-03 DIAGNOSIS — R0789 Other chest pain: Secondary | ICD-10-CM | POA: Insufficient documentation

## 2013-10-03 DIAGNOSIS — I10 Essential (primary) hypertension: Secondary | ICD-10-CM | POA: Insufficient documentation

## 2013-10-03 DIAGNOSIS — Z872 Personal history of diseases of the skin and subcutaneous tissue: Secondary | ICD-10-CM | POA: Insufficient documentation

## 2013-10-03 DIAGNOSIS — J45909 Unspecified asthma, uncomplicated: Secondary | ICD-10-CM | POA: Insufficient documentation

## 2013-10-03 DIAGNOSIS — Z79899 Other long term (current) drug therapy: Secondary | ICD-10-CM | POA: Insufficient documentation

## 2013-10-03 DIAGNOSIS — Z8739 Personal history of other diseases of the musculoskeletal system and connective tissue: Secondary | ICD-10-CM | POA: Insufficient documentation

## 2013-10-03 LAB — CBC
HEMATOCRIT: 46.1 % (ref 39.0–52.0)
HEMOGLOBIN: 15.5 g/dL (ref 13.0–17.0)
MCH: 29.2 pg (ref 26.0–34.0)
MCHC: 33.6 g/dL (ref 30.0–36.0)
MCV: 86.8 fL (ref 78.0–100.0)
Platelets: 223 10*3/uL (ref 150–400)
RBC: 5.31 MIL/uL (ref 4.22–5.81)
RDW: 13.6 % (ref 11.5–15.5)
WBC: 5.5 10*3/uL (ref 4.0–10.5)

## 2013-10-03 LAB — I-STAT TROPONIN, ED: Troponin i, poc: 0 ng/mL (ref 0.00–0.08)

## 2013-10-03 LAB — D-DIMER, QUANTITATIVE (NOT AT ARMC)

## 2013-10-03 MED ORDER — ASPIRIN 325 MG PO TABS
325.0000 mg | ORAL_TABLET | ORAL | Status: AC
  Administered 2013-10-03: 325 mg via ORAL
  Filled 2013-10-03: qty 1

## 2013-10-03 NOTE — ED Notes (Signed)
Per pt report: Pt has been having a chest pain for the past couple of days.  Pt reports a aching pain.  [place] fire [place] and asked the EMS to do 12 lead which showed a RBB.  Pt reports pain on his left side that radiates down the left arm.  Pt a/o x 4.  Pt denies SOB, N/V, lightheadedness.  No diaphoresis noted. Pt ambulatory in triage.

## 2013-10-03 NOTE — ED Notes (Addendum)
Patient c/o left sided chest pain radiating to Left shoulder and arm x3 days. Patient states he stopped by the fire department and had an EKG done, was told he had a R BBB and then came to ER via POV. Patient denies any prior episodes of the same.

## 2013-10-03 NOTE — ED Provider Notes (Signed)
CSN: [id number]     Arrival date & time 10/03/13  2048 History   First MD Initiated Contact with Patient 10/03/13 2137     Chief Complaint  Patient presents with  . Chest Pain     (Consider location/radiation/quality/duration/timing/severity/associated sxs/prior Treatment) HPI Comments: 53 yo male with HTN, back pain hx presents with left upper chest pain constant for 2-3 days, worse with movement.  No diaphoresis or exertional component. Patient denies blood clot history, active cancer, recent major trauma or surgery, unilateral leg swelling/ pain, recent long travel, hemoptysis or oral contraceptives.  Pt feels well otherwise, no injury. Pt firefighter.  No FH early cardiac however parents had MI and CHF.  Non radiating   Patient is a 53 y.o. male presenting with chest pain. The history is provided by the patient.  Chest Pain Associated symptoms: no abdominal pain, no back pain, no fever, no headache, no shortness of breath and not vomiting     Past Medical History  Diagnosis Date  . Back pain   . Capsulitis of foot   . Asthma   . Hypertension   . Prolapsed intervertebral disk    Past Surgical History  Procedure Laterality Date  . Hernia repair    . Carpal tunnel release     No family history on file. History  Substance Use Topics  . Smoking status: Never Smoker   . Smokeless tobacco: Not on file  . Alcohol Use: Yes    Review of Systems  Constitutional: Negative for fever and chills.  HENT: Negative for congestion.   Eyes: Negative for visual disturbance.  Respiratory: Negative for shortness of breath.   Cardiovascular: Positive for chest pain. Negative for leg swelling.  Gastrointestinal: Negative for vomiting and abdominal pain.  Genitourinary: Negative for dysuria and flank pain.  Musculoskeletal: Negative for back pain, neck pain and neck stiffness.  Skin: Negative for rash.  Neurological: Negative for light-headedness and headaches.      Allergies  Review  of patient's allergies indicates no known allergies.  Home Medications   Current Outpatient Rx  Name  Route  Sig  Dispense  Refill  . albuterol (PROVENTIL HFA;VENTOLIN HFA) 108 (90 BASE) MCG/ACT inhaler   Inhalation   Inhale 2 puffs into the lungs every 6 (six) hours as needed for wheezing or shortness of breath.          . theophylline (THEODUR) 100 MG 12 hr tablet   Oral   Take 100 mg by mouth daily.         Marland Kitchen. amLODipine (NORVASC) 10 MG tablet   Oral   Take 10 mg by mouth daily.            BP 151/92  Pulse 84  Temp(Src) 98.8 F (37.1 C) (Oral)  Resp 18  Ht 5\' 3"  (1.6 m)  Wt 245 lb (111.131 kg)  BMI 43.41 kg/m2  SpO2 96% Physical Exam  Nursing note and vitals reviewed. Constitutional: He is oriented to person, place, and time. He appears well-developed and well-nourished.  HENT:  Head: Normocephalic and atraumatic.  Eyes: Conjunctivae are normal. Right eye exhibits no discharge. Left eye exhibits no discharge.  Neck: Normal range of motion. Neck supple. No tracheal deviation present.  Cardiovascular: Normal rate and regular rhythm.   Pulmonary/Chest: Effort normal and breath sounds normal.  Abdominal: Soft. He exhibits no distension. There is no tenderness. There is no guarding.  Musculoskeletal: He exhibits tenderness. He exhibits no edema.  Left upper pectoralis tender with  flexion  Neurological: He is alert and oriented to person, place, and time.  Skin: Skin is warm. No rash noted.  Psychiatric: He has a normal mood and affect.    ED Course  Procedures (including critical care time) Labs Review Labs Reviewed  CBC  D-DIMER, QUANTITATIVE  I-STAT TROPOININ, ED  I-STAT TROPOININ, ED   Imaging Review No results found.   EKG Interpretation   Date/Time:  Monday October 03 2013 21:03:07 EST Ventricular Rate:  76 PR Interval:  163 QRS Duration: 131 QT Interval:  403 QTC Calculation: 453 R Axis:   -67 Text Interpretation:  Sinus rhythm RBBB and LAFB  Baseline wander in  lead(s) V2 Similar to previous Confirmed by Oaklee Sunga  MD, Uriel Dowding ([id number]) on  10/03/2013 9:51:26 PM      MDM   Final diagnoses:  None   Well appearing. Atypical CP likely MSK.  Pt has one risk factor cardiac, very low risk PE. Plan for delta troponin, D dimer, asa and outpt stress test if results okay. Ddimer neg  Pt improved on recheck.  Pt will discuss outpt stress wit pcp or cardiology. Results and differential diagnosis were discussed with the patient. Close follow up outpatient was discussed, patient comfortable with the plan.        Enid Skeens, MD 10/04/13 [phone]

## 2013-10-04 LAB — I-STAT TROPONIN, ED: Troponin i, poc: 0.01 ng/mL (ref 0.00–0.08)

## 2013-10-04 NOTE — Discharge Instructions (Signed)
Take baby aspirin daily and follow up to discuss possible stress test.  If you were given medicines take as directed.  If you are on coumadin or contraceptives realize their levels and effectiveness is altered by many different medicines.  If you have any reaction (rash, tongues swelling, other) to the medicines stop taking and see a physician.   Please follow up as directed and return to the ER or see a physician for new or worsening symptoms.  Thank you.

## 2013-10-17 ENCOUNTER — Ambulatory Visit (INDEPENDENT_AMBULATORY_CARE_PROVIDER_SITE_OTHER): Payer: Federal, State, Local not specified - PPO | Admitting: Family Medicine

## 2013-10-17 VITALS — BP 138/92 | HR 99 | Temp 98.3°F | Resp 18 | Ht 63.0 in | Wt 242.0 lb

## 2013-10-17 DIAGNOSIS — I10 Essential (primary) hypertension: Secondary | ICD-10-CM | POA: Insufficient documentation

## 2013-10-17 MED ORDER — AMLODIPINE BESY-BENAZEPRIL HCL 5-10 MG PO CAPS: 1.0000 | ORAL_CAPSULE | Freq: Every day | ORAL | Status: DC

## 2013-10-17 NOTE — Patient Instructions (Signed)

## 2013-10-17 NOTE — Progress Notes (Signed)
This chart was scribed for Elvina Sidle, MD by Luisa Dago, ED Scribe. This patient was seen in room 8 and the patient's care was started at 6:42 PM.   Patient ID: Matthew Sawyer MRN: [id number], DOB: 01/07/1961, 53 y.o. Date of Encounter: 10/17/2013, 6:36 PM  Primary Physician: No primary provider on file.  Chief Complaint:  Chief Complaint  Patient presents with   Hypertension     HPI: 53 y.o. 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 Pt states that he is currently between physicians due to some personal events. He states that he has not been taking his medication for a year because of that. Pt states that he was taken to ED because he was complaining of chest pain. He states that they ruled out an MI. They told him to find a PCP who will represcibe him BP medication.   Past Medical History  Diagnosis Date   Back pain    Capsulitis of foot    Asthma    Hypertension    Prolapsed intervertebral disk      Home Meds: Prior to Admission medications   Medication Sig Start Date End Date Taking? Authorizing Provider  albuterol (PROVENTIL HFA;VENTOLIN HFA) 108 (90 BASE) MCG/ACT inhaler Inhale 2 puffs into the lungs every 6 (six) hours as needed for wheezing or shortness of breath.    Yes Historical Provider, MD  amLODipine (NORVASC) 10 MG tablet Take 10 mg by mouth daily.     Yes Historical Provider, MD  theophylline (THEODUR) 100 MG 12 hr tablet Take 100 mg by mouth daily.   Yes Historical Provider, MD    Allergies: No Known Allergies  History   Social History   Marital Status: Single    Spouse Name: N/A    Number of Children: N/A   Years of Education: N/A   Occupational History   Not on file.   Social History Main Topics   Smoking status: Never Smoker    Smokeless tobacco: Not on file   Alcohol Use: Yes   Drug Use: No   Sexual Activity: Yes   Copy: None   Other Topics Concern   Not on file   Social History Narrative   No narrative on file     Review of Systems: high blood pressure Constitutional: negative for chills, fever, night sweats, weight changes, or fatigue  HEENT: negative for vision changes, hearing loss, congestion, rhinorrhea, ST, epistaxis, or sinus pressure Cardiovascular: negative for chest pain or palpitations Respiratory: negative for hemoptysis, wheezing, shortness of breath, or cough Abdominal: negative for abdominal pain, nausea, vomiting, diarrhea, or constipation Dermatological: negative for rash Neurologic: negative for headache, dizziness, or syncope All other systems reviewed and are otherwise negative with the exception to those above and in the HPI.   Physical Exam: HEENT is remarkable. No edema. Heart sounds are normal. No murmur. No rubs. No gallops.  Blood pressure 138/92, pulse 99, temperature 98.3 F (36.8 C), resp. rate 18, height 5\' 3"  (1.6 m), weight 242 lb (109.77 kg), SpO2 95.00%., Body mass index is 42.88 kg/(m^2). General: Well developed, well nourished, in no acute distress. Head: Normocephalic, atraumatic, eyes without discharge, sclera non-icteric, nares are without discharge. Bilateral auditory canals clear, TM's are without perforation, pearly grey and translucent with reflective cone of light bilaterally. Oral cavity moist, posterior pharynx without exudate, erythema, peritonsillar abscess, or post nasal drip.  Neck:  Supple. No thyromegaly. Full ROM. No lymphadenopathy. Lungs: Clear bilaterally to auscultation without wheezes, rales, or rhonchi. Breathing is unlabored. Heart: RRR with S1 S2. No murmurs, rubs, or gallops appreciated. Abdomen: Soft, non-tender, non-distended with normoactive bowel sounds. No hepatomegaly. No rebound/guarding. No obvious abdominal masses. Msk:  Strength and tone normal for age. Extremities/Skin: Warm and dry. No clubbing or  cyanosis. No edema. No rashes or suspicious lesions. Neuro: Alert and oriented X 3. Moves all extremities spontaneously. Gait is normal. CNII-XII grossly in tact. Psych:  Responds to questions appropriately with a normal affect.   Labs:   ASSESSMENT AND PLAN:  53 y.o. year old male with Hypertension - Plan: amLODipine-benazepril (LOTREL) 5-10 MG per capsule  Morbid obesity     Signed, Elvina SidleKurt Lauenstein, MD 10/17/2013 6:36 PM

## 2014-04-03 ENCOUNTER — Ambulatory Visit (INDEPENDENT_AMBULATORY_CARE_PROVIDER_SITE_OTHER): Payer: Federal, State, Local not specified - PPO | Admitting: Emergency Medicine

## 2014-04-03 ENCOUNTER — Ambulatory Visit (INDEPENDENT_AMBULATORY_CARE_PROVIDER_SITE_OTHER): Payer: Federal, State, Local not specified - PPO

## 2014-04-03 VITALS — BP 122/80 | HR 70 | Temp 97.3°F | Resp 18 | Ht 63.25 in | Wt 239.0 lb

## 2014-04-03 DIAGNOSIS — M25472 Effusion, left ankle: Secondary | ICD-10-CM

## 2014-04-03 DIAGNOSIS — S99919A Unspecified injury of unspecified ankle, initial encounter: Secondary | ICD-10-CM

## 2014-04-03 DIAGNOSIS — S99912A Unspecified injury of left ankle, initial encounter: Secondary | ICD-10-CM

## 2014-04-03 DIAGNOSIS — S93409A Sprain of unspecified ligament of unspecified ankle, initial encounter: Secondary | ICD-10-CM

## 2014-04-03 DIAGNOSIS — S99929A Unspecified injury of unspecified foot, initial encounter: Secondary | ICD-10-CM

## 2014-04-03 DIAGNOSIS — M25473 Effusion, unspecified ankle: Secondary | ICD-10-CM

## 2014-04-03 DIAGNOSIS — M25476 Effusion, unspecified foot: Secondary | ICD-10-CM

## 2014-04-03 DIAGNOSIS — S8990XA Unspecified injury of unspecified lower leg, initial encounter: Secondary | ICD-10-CM

## 2014-04-03 DIAGNOSIS — I10 Essential (primary) hypertension: Secondary | ICD-10-CM

## 2014-04-03 MED ORDER — ACETAMINOPHEN-CODEINE #3 300-30 MG PO TABS: 1.0000 | ORAL_TABLET | ORAL | Status: DC | PRN

## 2014-04-03 MED ORDER — AMLODIPINE BESY-BENAZEPRIL HCL 5-10 MG PO CAPS: 1.0000 | ORAL_CAPSULE | Freq: Every day | ORAL | Status: DC

## 2014-04-03 MED ORDER — NAPROXEN SODIUM 550 MG PO TABS: 550.0000 mg | ORAL_TABLET | Freq: Two times a day (BID) | ORAL | Status: AC

## 2014-04-03 NOTE — Progress Notes (Signed)
[facility] [place] [address] [place] [address] [phone]  Date:  04/03/2014   Name:  Matthew Sawyer   DOB:  1961-03-12   MRN:  [id number]  PCP:  No PCP Per Patient    Chief Complaint: Fall, Foot Swelling and Medication Refill   History of Present Illness:  Matthew Sawyer is a 53 y.o. very pleasant male patient who presents with the following:  History of hypertension.  Needs refill on medications.  No labs since 2010.  No evidence end organ disease Larey Seat on Wednesday and injured his left ankle.  Not able to bear weight since.   Moderate swelling lateral ankle. No bruising. No improvement with over the counter medications or other home remedies. Denies other complaint or health concern today.   Patient Active Problem List   Diagnosis Date Noted  . Hypertension 10/17/2013  . Morbid obesity 10/17/2013    Past Medical History  Diagnosis Date  . Back pain   . Capsulitis of foot   . Asthma   . Hypertension   . Prolapsed intervertebral disk     Past Surgical History  Procedure Laterality Date  . Hernia repair    . Carpal tunnel release      History  Substance Use Topics  . Smoking status: Never Smoker   . Smokeless tobacco: Not on file  . Alcohol Use: Yes    Family History  Problem Relation Age of Onset  . Heart disease Mother   . Heart disease Father     No Known Allergies  Medication list has been reviewed and updated.  Current Outpatient Prescriptions on File Prior to Visit  Medication Sig Dispense Refill  . albuterol (PROVENTIL HFA;VENTOLIN HFA) 108 (90 BASE) MCG/ACT inhaler Inhale 2 puffs into the lungs every 6 (six) hours as needed for wheezing or shortness of breath.       Marland Kitchen amLODipine-benazepril (LOTREL) 5-10 MG per capsule Take 1 capsule by mouth daily.  30 capsule  5   No current facility-administered medications on file prior to visit.    Review of Systems:  As per HPI, otherwise negative.    Physical  Examination: Filed Vitals:   04/03/14 1453  BP: 122/80  Pulse: 70  Temp: 97.3 F (36.3 C)  Resp: 18   Filed Vitals:   04/03/14 1453  Height: 5' 3.25" (1.607 m)  Weight: 239 lb (108.41 kg)   Body mass index is 41.98 kg/(m^2). Ideal Body Weight: Weight in (lb) to have BMI = 25: 142   GEN: obese, NAD, Non-toxic, Alert & Oriented x 3 HEENT: Atraumatic, Normocephalic.  Ears and Nose: No external deformity. EXTR: No clubbing/cyanosis/edema NEURO: Normal gait.  PSYCH: Normally interactive. Conversant. Not depressed or anxious appearing.  Calm demeanor.  LEFT ankle:  Tender and swollen lateral malleolus.  No deformity or ecchymosis  Assessment and Plan: Hypertension Sprain ankle Labs pending Follow up in one week Boot   Signed,  Phillips Odor, MD   [facility] reading (PRIMARY) by  Dr. Dareen Piano.  No osseous injury.

## 2014-04-03 NOTE — Patient Instructions (Signed)

## 2014-05-31 ENCOUNTER — Other Ambulatory Visit: Payer: Self-pay | Admitting: Family Medicine

## 2014-10-18 ENCOUNTER — Other Ambulatory Visit: Payer: Self-pay

## 2014-10-18 MED ORDER — AMLODIPINE BESY-BENAZEPRIL HCL 5-10 MG PO CAPS: 1.0000 | ORAL_CAPSULE | Freq: Every day | ORAL | Status: DC

## 2015-01-17 ENCOUNTER — Ambulatory Visit (INDEPENDENT_AMBULATORY_CARE_PROVIDER_SITE_OTHER): Payer: Federal, State, Local not specified - PPO | Admitting: Podiatry

## 2015-01-17 ENCOUNTER — Encounter: Payer: Self-pay | Admitting: Podiatry

## 2015-01-17 VITALS — BP 149/90 | HR 87 | Resp 18

## 2015-01-17 DIAGNOSIS — M2010 Hallux valgus (acquired), unspecified foot: Secondary | ICD-10-CM

## 2015-01-17 DIAGNOSIS — G629 Polyneuropathy, unspecified: Secondary | ICD-10-CM

## 2015-01-17 DIAGNOSIS — S93609A Unspecified sprain of unspecified foot, initial encounter: Secondary | ICD-10-CM

## 2015-01-17 DIAGNOSIS — Q6650 Congenital pes planus, unspecified foot: Secondary | ICD-10-CM | POA: Diagnosis not present

## 2015-01-17 NOTE — Progress Notes (Signed)
Subjective:     Patient ID: Matthew Sawyer, male   DOB: 1960/08/24, 54 y.o.   MRN: [id number]  HPIThis patient presents to my office saying he fell walking last week and aggrevated his already painful feet.  He says he was eseen about six months ago and was doing well following sinus tarsi injections and Mobic.  He was diagnosed with severe HAV B/L, severe flatfeet and sinus tarsi pain both feet.  He presents today saying he placed his right foot in hot water and there it remained as his left foot was removed due to the hot water.  He also has weakness through the left ankle with swelling and pain..  This he relates to his fall.  He also has pain through the front of both ankles.  He presents for evaluation and treatment. He says he finds his ankle brace little help.   Review of Systems     Objective:   Physical Exam @BARCODE2D (Error - No data available.)@ Objective: Review of past medical history, medications, social history and allergies were performed.  Vascular: Dorsalis pedis and posterior tibial pulses were palpable B/L, capillary refill was  WNL B/L, temperature gradient was WNL B/L   Skin:  No signs of symptoms of infection or ulcers on both feet  Nails: appear healthy with no signs of mycosis or infections  Sensory: Semmes Weinstein monifilament WNl left foot.  Semmes Weinstein absent right foot.  KJ and AT reflex WNL left foot.  Absent DTR right foot.   Orthopedic: Orthopedic evaluation demonstrates all joints distal t ankle have full ROM without crepitus, muscle power WNL B/L.  He has weakness to PTT left foot.  He significant enlargement of TNJ left foot.  Palpable pain sinus tarsi both feet. Severe pes planus B/L and severe HAV deformity both feet.     Assessment:     Foot Sprain B/L  2. PTTD left foot.  Neuropathy plantar aspect right foot.     Plan:     ROV/ Xray   Injection sinus tarsi both feet.  Prescribed Mobic.  To consider brace for left foot and ankle.  To be  seen in 2 weeks at [facility] for reevaluation and brace evaluation. The neuropathy is due to his back problems.

## 2015-02-07 ENCOUNTER — Ambulatory Visit (INDEPENDENT_AMBULATORY_CARE_PROVIDER_SITE_OTHER): Payer: Federal, State, Local not specified - PPO | Admitting: Podiatry

## 2015-02-07 ENCOUNTER — Other Ambulatory Visit: Payer: Federal, State, Local not specified - PPO

## 2015-02-07 DIAGNOSIS — G629 Polyneuropathy, unspecified: Secondary | ICD-10-CM | POA: Diagnosis not present

## 2015-02-07 DIAGNOSIS — R52 Pain, unspecified: Secondary | ICD-10-CM | POA: Diagnosis not present

## 2015-02-07 DIAGNOSIS — M2010 Hallux valgus (acquired), unspecified foot: Secondary | ICD-10-CM | POA: Diagnosis not present

## 2015-02-07 DIAGNOSIS — S93609A Unspecified sprain of unspecified foot, initial encounter: Secondary | ICD-10-CM

## 2015-02-07 MED ORDER — MELOXICAM 15 MG PO TABS: 15.0000 mg | ORAL_TABLET | Freq: Every day | ORAL | Status: DC

## 2015-02-07 NOTE — Progress Notes (Signed)
Patient ID: Matthew Sawyer, male   DOB: 12/07/1960, 54 y.o.   MRN: [id number] Patient states both feet are doing some better  This patient returns f/u pain both feet due to sinus tarsitis.  He was treated with injection therapy and prescribe Mobic his last visit.  He presents today for continued evaluation.  He says he is 54% improved. He presents for evaluation and treatment.  Objective: Review of past medical history, medications, social history and allergies were performed.  Vascular: Dorsalis pedis and posterior tibial pulses were palpable B/L, capillary refill was  WNL B/L, temperature gradient was WNL B/L   Skin:  No signs of symptoms of infection or ulcers on both feet  Nails: appear healthy with no signs of mycosis or infections  Sensory: Semmes Weinstein monifilament WNL   Orthopedic: Orthopedic evaluation demonstrates all joints distal t ankle have full ROM without crepitus, muscle power WNL B/L.  He has continued pain in sinus tarsi both feet.  Swelling has diminished and better rearfoot motion noted.  Severe pes planus foot type with HAV deformities.  Foot sprain B/L  P.  ROV.  Prescribed Mobic .  To be casted for brace in one week.

## 2015-02-14 ENCOUNTER — Ambulatory Visit: Payer: Federal, State, Local not specified - PPO | Admitting: *Deleted

## 2015-02-14 DIAGNOSIS — R52 Pain, unspecified: Secondary | ICD-10-CM

## 2015-02-14 NOTE — Progress Notes (Signed)
Patient ID: Matthew Sawyer, male   DOB: 06/16/1961, 54 y.o.   MRN: [id number] Patient presents at Dr Aram BeechamMayer's request to be casted for a Mezzo brace with [facility]. Patient will follow up in 4 weeks to be fitted.

## 2015-03-02 ENCOUNTER — Encounter: Payer: Self-pay | Admitting: Podiatry

## 2015-03-02 ENCOUNTER — Ambulatory Visit (INDEPENDENT_AMBULATORY_CARE_PROVIDER_SITE_OTHER): Payer: Federal, State, Local not specified - PPO | Admitting: Podiatry

## 2015-03-02 VITALS — BP 135/82 | HR 76 | Resp 18

## 2015-03-02 DIAGNOSIS — S93609A Unspecified sprain of unspecified foot, initial encounter: Secondary | ICD-10-CM | POA: Diagnosis not present

## 2015-03-02 DIAGNOSIS — M2010 Hallux valgus (acquired), unspecified foot: Secondary | ICD-10-CM

## 2015-03-02 DIAGNOSIS — Q6650 Congenital pes planus, unspecified foot: Secondary | ICD-10-CM

## 2015-03-02 NOTE — Progress Notes (Signed)
Subjective:     Patient ID: Matthew Sawyer, male   DOB: [birth year], 54 y.o.   MRN: [id number]  HPIThis patient presents today saying he is falling regularly now.  He has a bad right knee for which he wears a brace.  He says his left foot also gives out and he falls.  We are presently waiting on brace for his left foot.  He presents to discuss his situation.   Review of Systems     Objective:   Physical Exam  GENERAL APPEARANCE: Alert, conversant. Appropriately groomed. No acute distress.  VASCULAR: Pedal pulses palpable at 2/4 DP and PT bilateral.  Capillary refill time is immediate to all digits,  Proximal to distal cooling it warm to warm.  Digital hair growth is present bilateral  NEUROLOGIC: sensation is intact epicritically and protectively to 5.07 monofilament at 5/5 sites bilateral.  Light touch is intact bilateral, vibratory sensation intact bilateral, achilles tendon reflex is intact bilateral.  MUSCULOSKELETAL: acceptable muscle strength, tone and stability bilateral.  Intrinsic muscluature intact bilateral.  Rectus appearance of foot and digits noted bilateral. Severe pes planus both feet  HAV 1st MPJ  B/L  DERMATOLOGIC: skin color, texture, and turgor are within normal limits.  No preulcerative lesions or ulcers  are seen, no interdigital maceration noted.  No open lesions present.  Digital nails are asymptomatic. No drainage noted.     Assessment:     Pes planus with HAV     Plan:  ROV  Discussed condition with patient.  I recommended he use his cam walker to walk until the brace comes in.  After using the brace on the left foot we will either consider brace for right  foot also or consider surgical consultation

## 2015-03-12 ENCOUNTER — Telehealth: Payer: Self-pay | Admitting: *Deleted

## 2015-03-12 MED ORDER — MELOXICAM 15 MG PO TABS: 15.0000 mg | ORAL_TABLET | Freq: Every day | ORAL | Status: DC

## 2015-03-12 NOTE — Telephone Encounter (Addendum)
Pt request refill Mobic.  Dr. Stacie Acres refilled and pt is to be seen on 03/14/2015.

## 2015-03-13 MED ORDER — MELOXICAM 15 MG PO TABS: 15.0000 mg | ORAL_TABLET | Freq: Every day | ORAL | Status: DC

## 2015-03-14 ENCOUNTER — Encounter: Payer: Self-pay | Admitting: Podiatry

## 2015-03-14 ENCOUNTER — Ambulatory Visit: Payer: Federal, State, Local not specified - PPO | Admitting: *Deleted

## 2015-03-14 DIAGNOSIS — Q6650 Congenital pes planus, unspecified foot: Secondary | ICD-10-CM

## 2015-03-14 NOTE — Progress Notes (Signed)
Patient ID: Matthew Sawyer, male   DOB: 1961-05-06, 54 y.o.   MRN: [id number] Patient presents for fitting of Mezzo brace with [facility]

## 2015-05-01 ENCOUNTER — Ambulatory Visit (INDEPENDENT_AMBULATORY_CARE_PROVIDER_SITE_OTHER): Payer: Federal, State, Local not specified - PPO | Admitting: Podiatry

## 2015-05-01 ENCOUNTER — Encounter: Payer: Self-pay | Admitting: Podiatry

## 2015-05-01 DIAGNOSIS — R260 Ataxic gait: Secondary | ICD-10-CM | POA: Diagnosis not present

## 2015-05-01 DIAGNOSIS — R52 Pain, unspecified: Secondary | ICD-10-CM

## 2015-05-01 DIAGNOSIS — M19072 Primary osteoarthritis, left ankle and foot: Secondary | ICD-10-CM | POA: Diagnosis not present

## 2015-05-01 DIAGNOSIS — M2010 Hallux valgus (acquired), unspecified foot: Secondary | ICD-10-CM

## 2015-05-01 DIAGNOSIS — Q6650 Congenital pes planus, unspecified foot: Secondary | ICD-10-CM | POA: Diagnosis not present

## 2015-05-01 DIAGNOSIS — M2142 Flat foot [pes planus] (acquired), left foot: Secondary | ICD-10-CM

## 2015-05-01 DIAGNOSIS — R262 Difficulty in walking, not elsewhere classified: Secondary | ICD-10-CM | POA: Diagnosis not present

## 2015-05-01 DIAGNOSIS — S93609A Unspecified sprain of unspecified foot, initial encounter: Secondary | ICD-10-CM

## 2015-05-01 DIAGNOSIS — M25572 Pain in left ankle and joints of left foot: Secondary | ICD-10-CM

## 2015-05-01 MED ORDER — MELOXICAM 15 MG PO TABS: 15.0000 mg | ORAL_TABLET | Freq: Every day | ORAL | Status: DC

## 2015-05-03 NOTE — Progress Notes (Signed)
Subjective:     Patient ID: Matthew Sawyer, male   DOB: 11/16/1960, 54 y.o.   MRN: [id number]  HPIThis patient returns to the office following usage of his new brace for his flatfoot and muscle weakness which leads to frequent falls.  He says the brace has been ineffec tive and he asks me when the brace will start working.  He says his pain is severe and the falls keep happening.  He relates to pain in the rearfoot as before.  He presents for evaluation and treatment.   Review of Systems     Objective:   Physical Exam    Physical Exam  GENERAL APPEARANCE: Alert, conversant. Appropriately groomed. No acute distress.  VASCULAR: Pedal pulses palpable at 2/4 DP and PT bilateral. Capillary refill time is immediate to all digits, Proximal to distal cooling it warm to warm. Digital hair growth is present bilateral  NEUROLOGIC: sensation is intact epicritically and protectively to 5.07 monofilament at 5/5 sites bilateral. Light touch is intact bilateral, vibratory sensation intact bilateral, achilles tendon reflex is intact bilateral.  MUSCULOSKELETAL: acceptable muscle strength, tone and stability bilateral. Intrinsic muscluature intact bilateral. Rectus appearance of foot and digits noted bilateral. Severe pes planus both feet HAV 1st MPJ B/L  DERMATOLOGIC: skin color, texture, and turgor are within normal limits. No preulcerative lesions or ulcers are seen, no interdigital maceration noted. No open lesions present. Digital nails are asymptomatic. No drainage noted           Assessment:  Foot Sprain  Pes planus  HAV B/l     Plan:     ROV  Sinus tarsi injection.  Mobic ordered.  I evaluated his new brace which I consented to him getting in lieu of the [place] Brace and I am disappointed in the flimsy nature of the brace.  I do not believe it can help his severe foot deformity.  I told him to return to [facility] to discuss the ineffectiveness of the brace.  She is scheduled to  return on 12 October.

## 2015-05-16 ENCOUNTER — Other Ambulatory Visit: Payer: Federal, State, Local not specified - PPO

## 2015-05-17 ENCOUNTER — Other Ambulatory Visit: Payer: Self-pay | Admitting: Emergency Medicine

## 2015-06-15 ENCOUNTER — Ambulatory Visit (INDEPENDENT_AMBULATORY_CARE_PROVIDER_SITE_OTHER): Payer: Federal, State, Local not specified - PPO | Admitting: Family Medicine

## 2015-06-15 VITALS — BP 112/68 | HR 90 | Temp 98.2°F | Resp 16 | Ht 63.0 in | Wt 239.4 lb

## 2015-06-15 DIAGNOSIS — I10 Essential (primary) hypertension: Secondary | ICD-10-CM | POA: Diagnosis not present

## 2015-06-15 MED ORDER — AMLODIPINE BESY-BENAZEPRIL HCL 5-10 MG PO CAPS: 1.0000 | ORAL_CAPSULE | Freq: Every day | ORAL | Status: DC

## 2015-06-15 NOTE — Progress Notes (Signed)
 @UMFCLOGO @  This chart was scribed for Elvina SidleKurt Lauenstein, MD by Andrew Auaven Small, ED Scribe. This patient was seen in room 2 and the patient's care was started at 12:10 PM.  Patient ID: Matthew Sawyer MRN: [id number], DOB: April 14, 1961, 54 y.o. Date of Encounter: 06/15/2015, 12:10 PM  Primary Physician: No PCP Per Patient  Chief Complaint: Medication refill   Chief Complaint  Patient presents with   Medication Refill    Amlodipine   HPI: 54 y.o. year old male with history below presents for refill of amlodipine.  Doing well without issues or complaints. Taking medication daily without adverse effects. Has been on medication for 1 month.   Pt possibly needs left ankle reconstruction. He is also planning right knee replacement with  Dr. 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  Past Medical History  Diagnosis Date   Back pain    Capsulitis of foot    Asthma    Hypertension    Prolapsed intervertebral disk      Home Meds: Prior to Admission medications   Medication Sig Start Date End Date Taking? Authorizing Provider  albuterol (PROVENTIL HFA;VENTOLIN HFA) 108 (90 BASE) MCG/ACT inhaler Inhale 2 puffs into the lungs every 6 (six) hours as needed for wheezing or shortness of breath.    Yes Historical Provider, MD  amLODipine-benazepril (LOTREL) 5-10 MG per capsule Take 1 capsule by mouth daily. PATIENT NEEDS OFFICE VISIT FOR ADDITIONAL REFILLS 10/18/14  Yes Carmelina DaneJeffery S Anderson, MD  THEOPHYLLINE PO Take by mouth 2 (two) times daily.   Yes Historical Provider, MD  acetaminophen-codeine (TYLENOL #3) 300-30 MG per tablet Take 1-2 tablets by mouth every 4 (four) hours as needed. Patient not taking: Reported on 06/15/2015 04/03/14   Carmelina DaneJeffery S Anderson, MD  meloxicam (MOBIC) 15 MG tablet Take 1 tablet (15 mg total) by mouth daily. Patient not taking: Reported on 06/15/2015 03/13/15   Helane GuntherGregory Mayer, DPM    Allergies: No Known Allergies  Social History    Social History   Marital Status: Single    Spouse Name: N/A   Number of Children: N/A   Years of Education: N/A   Occupational History   Not on file.   Social History Main Topics   Smoking status: Never Smoker    Smokeless tobacco: Never Used   Alcohol Use: 0.0 oz/week    0 Standard drinks or equivalent per week   Drug Use: No   Sexual Activity: Yes    CopyBirth Control/ Protection: None   Other Topics Concern   Not on file   Social History Narrative     Review of Systems: Constitutional: negative for chills, fever, night sweats, weight changes, or fatigue  HEENT: negative for vision changes, hearing loss, congestion, rhinorrhea, ST, epistaxis, or sinus pressure Cardiovascular: negative for chest pain or palpitations Respiratory: negative for hemoptysis, wheezing, shortness of breath, or cough Abdominal: negative for abdominal pain, nausea, vomiting, diarrhea, or constipation Dermatological: negative for rash Neurologic: negative for headache, dizziness, or syncope All other systems reviewed and are otherwise negative with the exception to those above and in the HPI.   Physical Exam: Blood pressure 112/68, pulse 90, temperature 98.2 F (36.8 C), temperature source Oral, resp. rate 16, height 5\' 3"  (1.6 m), weight 239 lb 6.4 oz (108.591 kg), SpO2 96 %., Body mass index is 42.42 kg/(m^2). General: Well developed, well nourished, in no acute distress. Head: Normocephalic, atraumatic, eyes without discharge, sclera non-icteric, nares are without discharge. Bilateral  auditory canals clear, TM's are without perforation, pearly grey and translucent with reflective cone of light bilaterally. Oral cavity moist, posterior pharynx without exudate, erythema, peritonsillar abscess, or post nasal drip.  Neck: Supple. No thyromegaly. Full ROM. No lymphadenopathy. Lungs: Clear bilaterally to auscultation without wheezes, rales, or rhonchi. Breathing is unlabored. Heart: RRR with  S1 S2. No murmurs, rubs, or gallops appreciated. Msk:  Strength and tone normal for age. Extremities/Skin: Warm and dry. No clubbing or cyanosis. No edema. No rashes or suspicious lesions. Neuro: Alert and oriented X 3. Moves all extremities spontaneously. Gait is normal. CNII-XII grossly in tact. Psych:  Responds to questions appropriately with a normal affect.    ASSESSMENT AND PLAN:  54 y.o. year old male with hypertension here for medication refill.    ICD-9-CM ICD-10-CM   1. Essential hypertension 401.9 I10    By signing my name below, I, Raven Small, attest that this documentation has been prepared under the direction and in the presence of Elvina Sidle, MD.  Electronically Signed: Andrew Au, ED Scribe. 06/15/2015. 12:24 PM. This chart was scribed in my presence and reviewed by me personally.   Signed, Elvina Sidle, MD 06/15/2015 12:10 PM

## 2015-09-03 ENCOUNTER — Other Ambulatory Visit: Payer: Self-pay | Admitting: Orthopaedic Surgery

## 2015-09-04 ENCOUNTER — Ambulatory Visit (INDEPENDENT_AMBULATORY_CARE_PROVIDER_SITE_OTHER): Payer: Federal, State, Local not specified - PPO

## 2015-09-04 ENCOUNTER — Ambulatory Visit (INDEPENDENT_AMBULATORY_CARE_PROVIDER_SITE_OTHER): Payer: Federal, State, Local not specified - PPO | Admitting: Physician Assistant

## 2015-09-04 VITALS — BP 106/73 | HR 101 | Temp 98.4°F | Resp 20 | Ht 63.0 in | Wt 245.0 lb

## 2015-09-04 DIAGNOSIS — M25571 Pain in right ankle and joints of right foot: Secondary | ICD-10-CM

## 2015-09-04 DIAGNOSIS — S93401A Sprain of unspecified ligament of right ankle, initial encounter: Secondary | ICD-10-CM | POA: Diagnosis not present

## 2015-09-04 MED ORDER — MELOXICAM 7.5 MG PO TABS: 7.5000 mg | ORAL_TABLET | Freq: Every day | ORAL | Status: DC

## 2015-09-04 NOTE — Progress Notes (Signed)
[facility] [address] [place] [address] [phone]  Date:  09/04/2015   Name:  Matthew Sawyer   DOB:  1960-11-29   MRN:  [id number]  PCP:  No PCP Per Patient   Chief Complaint  Patient presents with  . Ankle Injury    twisted right ankle last nigh   History of Present Illness:  Matthew Sawyer is a 55 y.o. male patient who presents to [facility] for right ankle injury.  Patient reports that he was walking when his knee gave out on him. He felt an ankle inversion.  There was no popping, but intense pain.  He has had trouble bearing weight.  He has noticed swelling.  Patient has a hx of knee instability.  He will have a total knee replacement within a month.  He states that he has not done anything to relieve pain.  He does not wear a knee immobilizer at this time.  He states that this would slide down too much, so he has not worn anything around the knee.    Patient Active Problem List   Diagnosis Date Noted  . Hypertension 10/17/2013  . Morbid obesity (HCC) 10/17/2013    Past Medical History  Diagnosis Date  . Back pain   . Capsulitis of foot   . Asthma   . Hypertension   . Prolapsed intervertebral disk     Past Surgical History  Procedure Laterality Date  . Hernia repair    . Carpal tunnel release      Social History  Substance Use Topics  . Smoking status: Never Smoker   . Smokeless tobacco: Never Used  . Alcohol Use: 0.0 oz/week    0 Standard drinks or equivalent per week    Family History  Problem Relation Age of Onset  . Heart disease Mother   . Heart disease Father     No Known Allergies  Medication list has been reviewed and updated.  Current Outpatient Prescriptions on File Prior to Visit  Medication Sig Dispense Refill  . albuterol (PROVENTIL HFA;VENTOLIN HFA) 108 (90 BASE) MCG/ACT inhaler Inhale 2 puffs into the lungs every 6 (six) hours as needed for wheezing or shortness of breath.     Marland Kitchen amLODipine-benazepril  (LOTREL) 5-10 MG capsule Take 1 capsule by mouth daily. 90 capsule 3  . THEOPHYLLINE PO Take by mouth 2 (two) times daily.    Marland Kitchen acetaminophen-codeine (TYLENOL #3) 300-30 MG per tablet Take 1-2 tablets by mouth every 4 (four) hours as needed. (Patient not taking: Reported on 06/15/2015) 30 tablet 0  . meloxicam (MOBIC) 15 MG tablet Take 1 tablet (15 mg total) by mouth daily. (Patient not taking: Reported on 06/15/2015) 30 tablet 0   No current facility-administered medications on file prior to visit.    ROS ROS otherwise unremarkable unless listed above.   Physical Examination: BP 106/73 mmHg  Pulse 101  Temp(Src) 98.4 F (36.9 C) (Oral)  Resp 20  Ht  (1.6 m)  Wt 245 lb (111.131 kg)  BMI 43.41 kg/m2  SpO2 97% Ideal Body Weight: Weight in (lb) to have BMI = 25: 140.8  Physical Exam  Constitutional: He is oriented to person, place, and time. He appears well-developed and well-nourished. No distress.  HENT:  Head: Normocephalic and atraumatic.  Eyes: Conjunctivae and EOM are normal. Pupils are equal, round, and reactive to light.  Cardiovascular: Normal rate.   Pulmonary/Chest: Effort normal. No respiratory distress.  Musculoskeletal:  Right ankle: He exhibits decreased range of motion (plantar, dorsi-) and swelling. He exhibits normal pulse. Tenderness. Lateral malleolus and AITFL tenderness found. Achilles tendon exhibits no pain and normal Thompson's test results.  Pain with dorsi<plantar flexion.  Negative anterior drawer test.   Neurological: He is alert and oriented to person, place, and time.  Skin: Skin is warm and dry. He is not diaphoretic.  Psychiatric: He has a normal mood and affect. His behavior is normal.     Assessment and Plan: Matthew Sawyer is a 55 y.o. male who is here today for cc of right ankle injury.  This appears to be a sprain.  Given ice/anti-inflammatory/elevation discussion.  Placed in cam walker.  He is to use this with walker.  He will  return in 7-10 days if this pain has not resolved.    Right ankle pain - Plan: DG Ankle Complete Right, meloxicam (MOBIC) 7.5 MG tablet  Sprain of ankle, right, initial encounter - Plan: meloxicam (MOBIC) 7.5 MG tablet  Trena Platt, PA-C [facility] 1/31/20177:[id number] PM

## 2015-09-04 NOTE — Patient Instructions (Addendum)
Because you received an x-ray today, you will receive an invoice from [facility]. Please contact [facility] at [phone] with questions or concerns regarding your invoice. Our billing staff will not be able to assist you with those questions. Please ice the knee three times per day.  Elevate the ankle as much as possible Please pick 3 of the exercises to do three times per day in 6 reps.  If the stretch or exercise ask to hold, please hold for 10 seconds.  Please take the mobic.  Do not take ibuprofen or naproxen.  You may take tylenol.   I would like you out of the boot as possible.   Please return if you continue to have pain after 7-10 days.   Acute Ankle Sprain With Phase I Rehab An acute ankle sprain is a partial or complete tear in one or more of the ligaments of the ankle due to traumatic injury. The severity of the injury depends on both the number of ligaments sprained and the grade of sprain. There are 3 grades of sprains.   A grade 1 sprain is a mild sprain. There is a slight pull without obvious tearing. There is no loss of strength, and the muscle and ligament are the correct length.  A grade 2 sprain is a moderate sprain. There is tearing of fibers within the substance of the ligament where it connects two bones or two cartilages. The length of the ligament is increased, and there is usually decreased strength.  A grade 3 sprain is a complete rupture of the ligament and is uncommon. In addition to the grade of sprain, there are three types of ankle sprains.  Lateral ankle sprains: This is a sprain of one or more of the three ligaments on the outer side (lateral) of the ankle. These are the most common sprains. Medial ankle sprains: There is one large triangular ligament of the inner side (medial) of the ankle that is susceptible to injury. Medial ankle sprains are less common. Syndesmosis, "high ankle," sprains: The syndesmosis is the ligament that connects the  two bones of the lower leg. Syndesmosis sprains usually only occur with very severe ankle sprains. SYMPTOMS  Pain, tenderness, and swelling in the ankle, starting at the side of injury that may progress to the whole ankle and foot with time.  "Pop" or tearing sensation at the time of injury.  Bruising that may spread to the heel.  Impaired ability to walk soon after injury. CAUSES   Acute ankle sprains are caused by trauma placed on the ankle that temporarily forces or pries the anklebone (talus) out of its normal socket.  Stretching or tearing of the ligaments that normally hold the joint in place (usually due to a twisting injury). RISK INCREASES WITH:  Previous ankle sprain.  Sports in which the foot may land awkwardly (i.e., basketball, volleyball, or soccer) or walking or running on uneven or rough surfaces.  Shoes with inadequate support to prevent sideways motion when stress occurs.  Poor strength and flexibility.  Poor balance skills.  Contact sports. PREVENTION   Warm up and stretch properly before activity.  Maintain physical fitness:  Ankle and leg flexibility, muscle strength, and endurance.  Cardiovascular fitness.  Balance training activities.  Use proper technique and have a coach correct improper technique.  Taping, protective strapping, bracing, or high-top tennis shoes may help prevent injury. Initially, tape is best; however, it loses most of its support function within 10 to 15 minutes.  Wear  proper-fitted protective shoes (High-top shoes with taping or bracing is more effective than either alone).  Provide the ankle with support during sports and practice activities for 12 months following injury. PROGNOSIS   If treated properly, ankle sprains can be expected to recover completely; however, the length of recovery depends on the degree of injury.  A grade 1 sprain usually heals enough in 5 to 7 days to allow modified activity and requires an  average of 6 weeks to heal completely.  A grade 2 sprain requires 6 to 10 weeks to heal completely.  A grade 3 sprain requires 12 to 16 weeks to heal.  A syndesmosis sprain often takes more than 3 months to heal. RELATED COMPLICATIONS   Frequent recurrence of symptoms may result in a chronic problem. Appropriately addressing the problem the first time decreases the frequency of recurrence and optimizes healing time. Severity of the initial sprain does not predict the likelihood of later instability.  Injury to other structures (bone, cartilage, or tendon).  A chronically unstable or arthritic ankle joint is a possibility with repeated sprains. TREATMENT Treatment initially involves the use of ice, medication, and compression bandages to help reduce pain and inflammation. Ankle sprains are usually immobilized in a walking cast or boot to allow for healing. Crutches may be recommended to reduce pressure on the injury. After immobilization, strengthening and stretching exercises may be necessary to regain strength and a full range of motion. Surgery is rarely needed to treat ankle sprains. MEDICATION   Nonsteroidal anti-inflammatory medications, such as aspirin and ibuprofen (do not take for the first 3 days after injury or within 7 days before surgery), or other minor pain relievers, such as acetaminophen, are often recommended. Take these as directed by your caregiver. Contact your caregiver immediately if any bleeding, stomach upset, or signs of an allergic reaction occur from these medications.  Ointments applied to the skin may be helpful.  Pain relievers may be prescribed as necessary by your caregiver. Do not take prescription pain medication for longer than 4 to 7 days. Use only as directed and only as much as you need. HEAT AND COLD  Cold treatment (icing) is used to relieve pain and reduce inflammation for acute and chronic cases. Cold should be applied for 10 to 15 minutes every 2 to  3 hours for inflammation and pain and immediately after any activity that aggravates your symptoms. Use ice packs or an ice massage.  Heat treatment may be used before performing stretching and strengthening activities prescribed by your caregiver. Use a heat pack or a warm soak. SEEK IMMEDIATE MEDICAL CARE IF:   Pain, swelling, or bruising worsens despite treatment.  You experience pain, numbness, discoloration, or coldness in the foot or toes.  New, unexplained symptoms develop (drugs used in treatment may produce side effects.) EXERCISES  PHASE I EXERCISES RANGE OF MOTION (ROM) AND STRETCHING EXERCISES - Ankle Sprain, Acute Phase I, Weeks 1 to 2 These exercises may help you when beginning to restore flexibility in your ankle. You will likely work on these exercises for the 1 to 2 weeks after your injury. Once your physician, physical therapist, or athletic trainer sees adequate progress, he or she will advance your exercises. While completing these exercises, remember:   Restoring tissue flexibility helps normal motion to return to the joints. This allows healthier, less painful movement and activity.  An effective stretch should be held for at least 30 seconds.  A stretch should never be painful. You should only  feel a gentle lengthening or release in the stretched tissue. RANGE OF MOTION - Dorsi/Plantar Flexion  While sitting with your right / left knee straight, draw the top of your foot upwards by flexing your ankle. Then reverse the motion, pointing your toes downward.  Hold each position for __________ seconds.  After completing your first set of exercises, repeat this exercise with your knee bent. Repeat __________ times. Complete this exercise __________ times per day.  RANGE OF MOTION - Ankle Alphabet  Imagine your right / left big toe is a pen.  Keeping your hip and knee still, write out the entire alphabet with your "pen." Make the letters as large as you can without  increasing any discomfort. Repeat __________ times. Complete this exercise __________ times per day.  STRENGTHENING EXERCISES - Ankle Sprain, Acute -Phase I, Weeks 1 to 2 These exercises may help you when beginning to restore strength in your ankle. You will likely work on these exercises for 1 to 2 weeks after your injury. Once your physician, physical therapist, or athletic trainer sees adequate progress, he or she will advance your exercises. While completing these exercises, remember:   Muscles can gain both the endurance and the strength needed for everyday activities through controlled exercises.  Complete these exercises as instructed by your physician, physical therapist, or athletic trainer. Progress the resistance and repetitions only as guided.  You may experience muscle soreness or fatigue, but the pain or discomfort you are trying to eliminate should never worsen during these exercises. If this pain does worsen, stop and make certain you are following the directions exactly. If the pain is still present after adjustments, discontinue the exercise until you can discuss the trouble with your clinician. STRENGTH - Dorsiflexors  Secure a rubber exercise band/tubing to a fixed object (i.e., table, pole) and loop the other end around your right / left foot.  Sit on the floor facing the fixed object. The band/tubing should be slightly tense when your foot is relaxed.  Slowly draw your foot back toward you using your ankle and toes.  Hold this position for __________ seconds. Slowly release the tension in the band and return your foot to the starting position. Repeat __________ times. Complete this exercise __________ times per day.  STRENGTH - Plantar-flexors   Sit with your right / left leg extended. Holding onto both ends of a rubber exercise band/tubing, loop it around the ball of your foot. Keep a slight tension in the band.  Slowly push your toes away from you, pointing them  downward.  Hold this position for __________ seconds. Return slowly, controlling the tension in the band/tubing. Repeat __________ times. Complete this exercise __________ times per day.  STRENGTH - Ankle Eversion  Secure one end of a rubber exercise band/tubing to a fixed object (table, pole). Loop the other end around your foot just before your toes.  Place your fists between your knees. This will focus your strengthening at your ankle.  Drawing the band/tubing across your opposite foot, slowly, pull your little toe out and up. Make sure the band/tubing is positioned to resist the entire motion.  Hold this position for __________ seconds. Have your muscles resist the band/tubing as it slowly pulls your foot back to the starting position.  Repeat __________ times. Complete this exercise __________ times per day.  STRENGTH - Ankle Inversion  Secure one end of a rubber exercise band/tubing to a fixed object (table, pole). Loop the other end around your foot just before  your toes.  Place your fists between your knees. This will focus your strengthening at your ankle.  Slowly, pull your big toe up and in, making sure the band/tubing is positioned to resist the entire motion.  Hold this position for __________ seconds.  Have your muscles resist the band/tubing as it slowly pulls your foot back to the starting position. Repeat __________ times. Complete this exercises __________ times per day.  STRENGTH - Towel Curls  Sit in a chair positioned on a non-carpeted surface.  Place your right / left foot on a towel, keeping your heel on the floor.  Pull the towel toward your heel by only curling your toes. Keep your heel on the floor.  If instructed by your physician, physical therapist, or athletic trainer, add weight to the end of the towel. Repeat __________ times. Complete this exercise __________ times per day.   This information is not intended to replace advice given to you by your  health care provider. Make sure you discuss any questions you have with your health care provider.   Document Released: 02/19/2005 Document Revised: 08/11/2014 Document Reviewed: 11/02/2008 Elsevier Interactive Patient Education [employer]

## 2015-09-20 ENCOUNTER — Encounter (HOSPITAL_COMMUNITY): Payer: Self-pay

## 2015-09-20 ENCOUNTER — Other Ambulatory Visit (HOSPITAL_COMMUNITY): Payer: Self-pay | Admitting: *Deleted

## 2015-09-20 ENCOUNTER — Ambulatory Visit (HOSPITAL_COMMUNITY)
Admission: RE | Admit: 2015-09-20 | Discharge: 2015-09-20 | Disposition: A | Discharge status: Home / Self Care | Payer: Federal, State, Local not specified - PPO | Source: Ambulatory Visit | Attending: Orthopaedic Surgery | Admitting: Orthopaedic Surgery

## 2015-09-20 ENCOUNTER — Other Ambulatory Visit: Payer: Self-pay

## 2015-09-20 DIAGNOSIS — Z01810 Encounter for preprocedural cardiovascular examination: Secondary | ICD-10-CM | POA: Insufficient documentation

## 2015-09-20 DIAGNOSIS — Z01812 Encounter for preprocedural laboratory examination: Secondary | ICD-10-CM | POA: Insufficient documentation

## 2015-09-20 DIAGNOSIS — J45909 Unspecified asthma, uncomplicated: Secondary | ICD-10-CM | POA: Diagnosis not present

## 2015-09-20 DIAGNOSIS — Z01818 Encounter for other preprocedural examination: Secondary | ICD-10-CM | POA: Insufficient documentation

## 2015-09-20 DIAGNOSIS — J8410 Pulmonary fibrosis, unspecified: Secondary | ICD-10-CM | POA: Diagnosis not present

## 2015-09-20 DIAGNOSIS — I10 Essential (primary) hypertension: Secondary | ICD-10-CM | POA: Diagnosis not present

## 2015-09-20 DIAGNOSIS — Z79899 Other long term (current) drug therapy: Secondary | ICD-10-CM | POA: Diagnosis not present

## 2015-09-20 DIAGNOSIS — E669 Obesity, unspecified: Secondary | ICD-10-CM | POA: Diagnosis not present

## 2015-09-20 HISTORY — DX: Pneumonia, unspecified organism: J18.9

## 2015-09-20 HISTORY — DX: Personal history of other diseases of the digestive system: Z87.19

## 2015-09-20 HISTORY — DX: Constipation, unspecified: K59.00

## 2015-09-20 LAB — BASIC METABOLIC PANEL
ANION GAP: 11 (ref 5–15)
BUN: 8 mg/dL (ref 6–20)
CALCIUM: 9.9 mg/dL (ref 8.9–10.3)
CO2: 26 mmol/L (ref 22–32)
CREATININE: 1.04 mg/dL (ref 0.61–1.24)
Chloride: 103 mmol/L (ref 101–111)
Glucose, Bld: 93 mg/dL (ref 65–99)
Potassium: 4.2 mmol/L (ref 3.5–5.1)
SODIUM: 140 mmol/L (ref 135–145)

## 2015-09-20 LAB — CBC WITH DIFFERENTIAL/PLATELET
BASOS ABS: 0 10*3/uL (ref 0.0–0.1)
BASOS PCT: 1 %
EOS ABS: 0.6 10*3/uL (ref 0.0–0.7)
Eosinophils Relative: 10 %
HEMATOCRIT: 48 % (ref 39.0–52.0)
HEMOGLOBIN: 15.8 g/dL (ref 13.0–17.0)
Lymphocytes Relative: 29 %
Lymphs Abs: 1.8 10*3/uL (ref 0.7–4.0)
MCH: 28.8 pg (ref 26.0–34.0)
MCHC: 32.9 g/dL (ref 30.0–36.0)
MCV: 87.6 fL (ref 78.0–100.0)
MONOS PCT: 8 %
Monocytes Absolute: 0.5 10*3/uL (ref 0.1–1.0)
NEUTROS ABS: 3.3 10*3/uL (ref 1.7–7.7)
NEUTROS PCT: 52 %
PLATELETS: 206 10*3/uL (ref 150–400)
RBC: 5.48 MIL/uL (ref 4.22–5.81)
RDW: 13.8 % (ref 11.5–15.5)
WBC: 6.3 10*3/uL (ref 4.0–10.5)

## 2015-09-20 LAB — URINALYSIS, ROUTINE W REFLEX MICROSCOPIC
BILIRUBIN URINE: NEGATIVE
GLUCOSE, UA: NEGATIVE mg/dL
HGB URINE DIPSTICK: NEGATIVE
Ketones, ur: NEGATIVE mg/dL
Leukocytes, UA: NEGATIVE
Nitrite: NEGATIVE
PH: 6 (ref 5.0–8.0)
Protein, ur: NEGATIVE mg/dL
SPECIFIC GRAVITY, URINE: 1.018 (ref 1.005–1.030)

## 2015-09-20 LAB — SURGICAL PCR SCREEN
MRSA, PCR: NEGATIVE
STAPHYLOCOCCUS AUREUS: NEGATIVE

## 2015-09-20 LAB — TYPE AND SCREEN
ABO/RH(D): O POS
ANTIBODY SCREEN: NEGATIVE

## 2015-09-20 LAB — APTT: APTT: 32 s (ref 24–37)

## 2015-09-20 LAB — PROTIME-INR
INR: 1.09 (ref 0.00–1.49)
Prothrombin Time: 14.3 seconds (ref 11.6–15.2)

## 2015-09-20 LAB — ABO/RH: ABO/RH(D): O POS

## 2015-09-20 NOTE — Progress Notes (Signed)
Pt denies cardiac history, chest pain or sob. Pt does not have a PCP, unable to send positive Stop Bang assessment.

## 2015-09-20 NOTE — Progress Notes (Signed)
   09/20/15 1431  OBSTRUCTIVE SLEEP APNEA  Have you ever been diagnosed with sleep apnea through a sleep study? No  Do you snore loudly (loud enough to be heard through closed doors)?  1  Do you often feel tired, fatigued, or sleepy during the daytime (such as falling asleep during driving or talking to someone)? 1  Has anyone observed you stop breathing during your sleep? 1  Do you have, or are you being treated for high blood pressure? 1  BMI more than 35 kg/m2? 1  Age > 50 (1-yes) 1  Neck circumference greater than:Male 16 inches or larger, Male 17inches or larger? 1  Male Gender (Yes=1) 1  Obstructive Sleep Apnea Score 8  Score 5 or greater  Results sent to PCP

## 2015-09-20 NOTE — Pre-Procedure Instructions (Signed)
Matthew Sawyer  09/20/2015      Your procedure is scheduled on Tuesday, October 02, 2015 at 7:30 AM.   Report to [facility] Entrance "A" Admitting Office at 5:30 AM.   Call this number if you have problems the morning of surgery: [phone]   Any questions prior to day of surgery, please call [phone] between 8 & 4 PM.   Remember:  Do not eat food or drink liquids after midnight Monday, 10/01/15.  Take these medicines the morning of surgery with A SIP OF WATER: Theophylline (Theodur), Albuterol inhaler - if needed  Stop NSAIDS (Meloxicam, Mobic, Ibuprofen, Aleve, etc.) 7 days prior to surgery. Do not use Aspirin products 7 days prior to surgery.   Do not wear jewelry.  Do not wear lotions, powders, or cologne.  You may wear deodorant.  Men may shave face and neck.  Do not bring valuables to the hospital.  [facility] is not responsible for any belongings or valuables.  Contacts, dentures or bridgework may not be worn into surgery.  Leave your suitcase in the car.  After surgery it may be brought to your room.  For patients admitted to the hospital, discharge time will be determined by your treatment team.  Special instructions:  Matthew Sawyer - Preparing for Surgery  Before surgery, you can play an important role.  Because skin is not sterile, your skin needs to be as free of germs as possible.  You can reduce the number of germs on you skin by washing with CHG (chlorahexidine gluconate) soap before surgery.  CHG is an antiseptic cleaner which kills germs and bonds with the skin to continue killing germs even after washing.  Please DO NOT use if you have an allergy to CHG or antibacterial soaps.  If your skin becomes reddened/irritated stop using the CHG and inform your nurse when you arrive at Short Stay.  Do not shave (including legs and underarms) for at least 48 hours prior to the first CHG shower.  You may shave your face.  Please follow these  instructions carefully:   1.  Shower with CHG Soap the night before surgery and the                                morning of Surgery.  2.  If you choose to wash your hair, wash your hair first as usual with your       normal shampoo.  3.  After you shampoo, rinse your hair and body thoroughly to remove the                      Shampoo.  4.  Use CHG as you would any other liquid soap.  You can apply chg directly       to the skin and wash gently with scrungie or a clean washcloth.  5.  Apply the CHG Soap to your body ONLY FROM THE NECK DOWN.        Do not use on open wounds or open sores.  Avoid contact with your eyes, ears, mouth and genitals (private parts).  Wash genitals (private parts) with your normal soap.  6.  Wash thoroughly, paying special attention to the area where your surgery        will be performed.  7.  Thoroughly rinse your body with warm water from the neck down.  8.  DO NOT shower/wash with your normal soap after using and rinsing off       the CHG Soap.  9.  Pat yourself dry with a clean towel.            10.  Wear clean pajamas.            11.  Place clean sheets on your bed the night of your first shower and do not        sleep with pets.  Day of Surgery  Do not apply any lotions the morning of surgery.  Please wear clean clothes to the hospital.   Please read over the following fact sheets that you were given. Pain Booklet, Coughing and Deep Breathing, Blood Transfusion Information, MRSA Information and Surgical Site Infection Prevention

## 2015-09-21 ENCOUNTER — Other Ambulatory Visit: Payer: Self-pay | Admitting: Orthopaedic Surgery

## 2015-09-21 NOTE — Progress Notes (Signed)
Sent positive Stop Bang Assessment to Dr. Mosie Epstein who sees pt for his HTN meds. Pt stated that he didn't have a PCP.

## 2015-09-22 ENCOUNTER — Other Ambulatory Visit: Payer: Self-pay | Admitting: Family Medicine

## 2015-09-22 DIAGNOSIS — G479 Sleep disorder, unspecified: Secondary | ICD-10-CM

## 2015-09-24 NOTE — H&P (Signed)
TOTAL KNEE ADMISSION H&P  Patient is being admitted for right total knee arthroplasty.  Subjective:  Chief Complaint:right knee pain.  HPI: Matthew Sawyer, 55 y.o. male, has a history of pain and functional disability in the right knee due to arthritis and has failed non-surgical conservative treatments for greater than 12 weeks to includeNSAID's and/or analgesics, corticosteriod injections, flexibility and strengthening excercises, use of assistive devices, weight reduction as appropriate and activity modification.  Onset of symptoms was gradual, starting 5 years ago with gradually worsening course since that time. The patient noted no past surgery on the right knee(s).  Patient currently rates pain in the right knee(s) at 10 out of 10 with activity. Patient has night pain, worsening of pain with activity and weight bearing, pain that interferes with activities of daily living, crepitus and joint swelling.  Patient has evidence of subchondral cysts, subchondral sclerosis, periarticular osteophytes and joint space narrowing by imaging studies.There is no active infection.  Patient Active Problem List   Diagnosis Date Noted  . Hypertension 10/17/2013  . Morbid obesity (HCC) 10/17/2013   Past Medical History  Diagnosis Date  . Back pain   . Capsulitis of foot   . Asthma   . Hypertension   . Prolapsed intervertebral disk   . Pneumonia   . History of hiatal hernia   . Constipation     Past Surgical History  Procedure Laterality Date  . Carpal tunnel release Bilateral   . Hernia repair      navel  . Colonoscopy      No prescriptions prior to admission   No Known Allergies  Social History  Substance Use Topics  . Smoking status: Never Smoker   . Smokeless tobacco: Never Used  . Alcohol Use: 0.0 oz/week    0 Standard drinks or equivalent per week     Comment: occasional    Family History  Problem Relation Age of Onset  . Heart disease Mother   . Heart disease Father       Review of Systems  Musculoskeletal: Positive for joint pain.       Right knee  All other systems reviewed and are negative.   Objective:  Physical Exam  Constitutional: He is oriented to person, place, and time. He appears well-developed and well-nourished.  HENT:  Head: Normocephalic and atraumatic.  Eyes: Pupils are equal, round, and reactive to light.  Neck: Normal range of motion.  Cardiovascular: Normal rate and regular rhythm.   Respiratory: Effort normal.  GI: Soft.  Musculoskeletal:  His right knee motion is about 0-100. He has medial and lateral joint line pain with crepitation. There is no varus or valgus deformity. He has significant crepitation towards full extension.  Hip motion is full and pain free and SLR is negative on both sides.  There is no palpable LAD behind either knee.  Sensation and motor function are intact on both sides and there are palpable pulses on both sides.    Neurological: He is alert and oriented to person, place, and time.  Skin: Skin is warm and dry.  Psychiatric: He has a normal mood and affect. His behavior is normal. Judgment and thought content normal.    Vital signs in last 24 hours:    Labs:   Estimated body mass index is 41.85 kg/(m^2) as calculated from the following:   Height as of 09/20/15:  (1.6 m).   Weight as of 09/20/15: 107.14 kg (236 lb 3.2 oz).   Imaging Review Plain  radiographs demonstrate severe degenerative joint disease of the right knee(s). The overall alignment isneutral. The bone quality appears to be good for age and reported activity level.  Assessment/Plan:  End stage primary arthritis, right knee   The patient history, physical examination, clinical judgment of the provider and imaging studies are consistent with end stage degenerative joint disease of the right knee(s) and total knee arthroplasty is deemed medically necessary. The treatment options including medical management, injection therapy  arthroscopy and arthroplasty were discussed at length. The risks and benefits of total knee arthroplasty were presented and reviewed. The risks due to aseptic loosening, infection, stiffness, patella tracking problems, thromboembolic complications and other imponderables were discussed. The patient acknowledged the explanation, agreed to proceed with the plan and consent was signed. Patient is being admitted for inpatient treatment for surgery, pain control, PT, OT, prophylactic antibiotics, VTE prophylaxis, progressive ambulation and ADL's and discharge planning. The patient is planning to be discharged home with home health services

## 2015-10-01 NOTE — Progress Notes (Signed)
Procedure time changed to 1215.  Left voicemail for patient to arrive day of surgery at 1015.

## 2015-10-02 ENCOUNTER — Inpatient Hospital Stay (HOSPITAL_COMMUNITY)
Admission: RE | Admit: 2015-10-02 | Discharge: 2015-10-04 | DRG: 470 | Disposition: A | Discharge status: Home Health | Payer: Federal, State, Local not specified - PPO | Source: Ambulatory Visit | Attending: Orthopaedic Surgery | Admitting: Orthopaedic Surgery

## 2015-10-02 ENCOUNTER — Inpatient Hospital Stay (HOSPITAL_COMMUNITY): Payer: Federal, State, Local not specified - PPO | Admitting: Emergency Medicine

## 2015-10-02 ENCOUNTER — Encounter (HOSPITAL_COMMUNITY): Admission: RE | Payer: Self-pay | Source: Ambulatory Visit

## 2015-10-02 ENCOUNTER — Encounter (HOSPITAL_COMMUNITY)
Admission: RE | Disposition: A | Discharge status: Home Health | Payer: Self-pay | Source: Ambulatory Visit | Attending: Orthopaedic Surgery

## 2015-10-02 ENCOUNTER — Inpatient Hospital Stay (HOSPITAL_COMMUNITY)
Admission: RE | Admit: 2015-10-02 | Payer: Federal, State, Local not specified - PPO | Source: Ambulatory Visit | Admitting: Orthopaedic Surgery

## 2015-10-02 DIAGNOSIS — Z6841 Body Mass Index (BMI) 40.0 and over, adult: Secondary | ICD-10-CM

## 2015-10-02 DIAGNOSIS — I10 Essential (primary) hypertension: Secondary | ICD-10-CM | POA: Diagnosis present

## 2015-10-02 DIAGNOSIS — Z8249 Family history of ischemic heart disease and other diseases of the circulatory system: Secondary | ICD-10-CM

## 2015-10-02 DIAGNOSIS — M1711 Unilateral primary osteoarthritis, right knee: Principal | ICD-10-CM | POA: Diagnosis present

## 2015-10-02 DIAGNOSIS — M25561 Pain in right knee: Secondary | ICD-10-CM | POA: Diagnosis present

## 2015-10-02 HISTORY — PX: TOTAL KNEE ARTHROPLASTY: SHX125

## 2015-10-02 SURGERY — ARTHROPLASTY, KNEE, TOTAL
Anesthesia: Spinal | Laterality: Right

## 2015-10-02 SURGERY — ARTHROPLASTY, KNEE, TOTAL
Anesthesia: Spinal | Site: Knee | Laterality: Right

## 2015-10-02 MED ORDER — THEOPHYLLINE ER 300 MG PO TB12
300.0000 mg | ORAL_TABLET | Freq: Two times a day (BID) | ORAL | Status: DC
  Administered 2015-10-02 – 2015-10-04 (×4): 300 mg via ORAL
  Filled 2015-10-02 (×6): qty 1

## 2015-10-02 MED ORDER — MIDAZOLAM HCL 5 MG/5ML IJ SOLN
INTRAMUSCULAR | Status: DC | PRN
  Administered 2015-10-02: 2 mg via INTRAVENOUS

## 2015-10-02 MED ORDER — EPHEDRINE SULFATE 50 MG/ML IJ SOLN
INTRAMUSCULAR | Status: DC | PRN
  Administered 2015-10-02: 10 mg via INTRAVENOUS

## 2015-10-02 MED ORDER — ACETAMINOPHEN 325 MG PO TABS: 650.0000 mg | ORAL_TABLET | Freq: Four times a day (QID) | ORAL | Status: DC | PRN

## 2015-10-02 MED ORDER — LACTATED RINGERS IV SOLN
INTRAVENOUS | Status: DC
  Administered 2015-10-02 (×2): via INTRAVENOUS

## 2015-10-02 MED ORDER — 0.9 % SODIUM CHLORIDE (POUR BTL) OPTIME
TOPICAL | Status: DC | PRN
  Administered 2015-10-02: 1000 mL

## 2015-10-02 MED ORDER — ASPIRIN EC 325 MG PO TBEC
325.0000 mg | DELAYED_RELEASE_TABLET | Freq: Two times a day (BID) | ORAL | Status: DC
  Administered 2015-10-03 – 2015-10-04 (×3): 325 mg via ORAL
  Filled 2015-10-02 (×3): qty 1

## 2015-10-02 MED ORDER — LACTATED RINGERS IV SOLN
INTRAVENOUS | Status: DC
  Administered 2015-10-02: 18:00:00 via INTRAVENOUS

## 2015-10-02 MED ORDER — GLYCOPYRROLATE 0.2 MG/ML IJ SOLN
INTRAMUSCULAR | Status: DC | PRN
  Administered 2015-10-02: 0.1 mg via INTRAVENOUS

## 2015-10-02 MED ORDER — ALBUTEROL SULFATE (2.5 MG/3ML) 0.083% IN NEBU: 2.5000 mg | INHALATION_SOLUTION | Freq: Four times a day (QID) | RESPIRATORY_TRACT | Status: DC | PRN

## 2015-10-02 MED ORDER — TRANEXAMIC ACID 1000 MG/10ML IV SOLN
1000.0000 mg | INTRAVENOUS | Status: AC
  Administered 2015-10-02: 1000 mg via INTRAVENOUS
  Filled 2015-10-02: qty 10

## 2015-10-02 MED ORDER — PHENYLEPHRINE HCL 10 MG/ML IJ SOLN
INTRAMUSCULAR | Status: DC | PRN
  Administered 2015-10-02 (×2): 80 ug via INTRAVENOUS
  Administered 2015-10-02 (×2): 120 ug via INTRAVENOUS

## 2015-10-02 MED ORDER — DOCUSATE SODIUM 100 MG PO CAPS
100.0000 mg | ORAL_CAPSULE | Freq: Two times a day (BID) | ORAL | Status: DC
  Administered 2015-10-02 – 2015-10-04 (×4): 100 mg via ORAL
  Filled 2015-10-02 (×4): qty 1

## 2015-10-02 MED ORDER — METHOCARBAMOL 500 MG PO TABS: 500.0000 mg | ORAL_TABLET | Freq: Four times a day (QID) | ORAL | Status: DC | PRN

## 2015-10-02 MED ORDER — ALUM & MAG HYDROXIDE-SIMETH 200-200-20 MG/5ML PO SUSP: 30.0000 mL | ORAL | Status: DC | PRN

## 2015-10-02 MED ORDER — HYDROMORPHONE HCL 1 MG/ML IJ SOLN: 0.5000 mg | INTRAMUSCULAR | Status: DC | PRN

## 2015-10-02 MED ORDER — LACTATED RINGERS IV SOLN: INTRAVENOUS | Status: DC

## 2015-10-02 MED ORDER — DIPHENHYDRAMINE HCL 12.5 MG/5ML PO ELIX: 12.5000 mg | ORAL_SOLUTION | ORAL | Status: DC | PRN

## 2015-10-02 MED ORDER — SODIUM CHLORIDE 0.9 % IR SOLN
Status: DC | PRN
  Administered 2015-10-02: 3000 mL

## 2015-10-02 MED ORDER — CHLORHEXIDINE GLUCONATE 4 % EX LIQD: 60.0000 mL | Freq: Once | CUTANEOUS | Status: DC

## 2015-10-02 MED ORDER — BISACODYL 5 MG PO TBEC: 5.0000 mg | DELAYED_RELEASE_TABLET | Freq: Every day | ORAL | Status: DC | PRN

## 2015-10-02 MED ORDER — BUPIVACAINE IN DEXTROSE 0.75-8.25 % IT SOLN
INTRATHECAL | Status: DC | PRN
  Administered 2015-10-02: 1.6 mL via INTRATHECAL

## 2015-10-02 MED ORDER — BUPIVACAINE LIPOSOME 1.3 % IJ SUSP
INTRAMUSCULAR | Status: DC | PRN
  Administered 2015-10-02: 20 mL

## 2015-10-02 MED ORDER — ONDANSETRON HCL 4 MG/2ML IJ SOLN: 4.0000 mg | Freq: Four times a day (QID) | INTRAMUSCULAR | Status: DC | PRN

## 2015-10-02 MED ORDER — BUPIVACAINE LIPOSOME 1.3 % IJ SUSP
20.0000 mL | INTRAMUSCULAR | Status: DC
  Filled 2015-10-02: qty 20

## 2015-10-02 MED ORDER — METOCLOPRAMIDE HCL 5 MG PO TABS: 5.0000 mg | ORAL_TABLET | Freq: Three times a day (TID) | ORAL | Status: DC | PRN

## 2015-10-02 MED ORDER — MENTHOL 3 MG MT LOZG
1.0000 | LOZENGE | OROMUCOSAL | Status: DC | PRN
  Administered 2015-10-02: 3 mg via ORAL
  Filled 2015-10-02: qty 9

## 2015-10-02 MED ORDER — INFLUENZA VAC SPLIT QUAD 0.5 ML IM SUSY
0.5000 mL | PREFILLED_SYRINGE | INTRAMUSCULAR | Status: AC
  Administered 2015-10-04: 0.5 mL via INTRAMUSCULAR
  Filled 2015-10-02: qty 0.5

## 2015-10-02 MED ORDER — DEXTROSE 5 % IV SOLN
3.0000 g | INTRAVENOUS | Status: AC
  Administered 2015-10-02: 3 g via INTRAVENOUS
  Filled 2015-10-02: qty 3000

## 2015-10-02 MED ORDER — BENAZEPRIL HCL 20 MG PO TABS
10.0000 mg | ORAL_TABLET | Freq: Every day | ORAL | Status: DC
  Administered 2015-10-03 – 2015-10-04 (×2): 10 mg via ORAL
  Filled 2015-10-02 (×2): qty 1

## 2015-10-02 MED ORDER — AMLODIPINE BESYLATE 5 MG PO TABS
5.0000 mg | ORAL_TABLET | Freq: Every day | ORAL | Status: DC
  Administered 2015-10-03 – 2015-10-04 (×2): 5 mg via ORAL
  Filled 2015-10-02 (×2): qty 1

## 2015-10-02 MED ORDER — BUPIVACAINE-EPINEPHRINE (PF) 0.5% -1:200000 IJ SOLN
INTRAMUSCULAR | Status: DC | PRN
  Administered 2015-10-02: 20 mL

## 2015-10-02 MED ORDER — ACETAMINOPHEN 650 MG RE SUPP: 650.0000 mg | Freq: Four times a day (QID) | RECTAL | Status: DC | PRN

## 2015-10-02 MED ORDER — PHENOL 1.4 % MT LIQD
1.0000 | OROMUCOSAL | Status: DC | PRN
Start: 2015-10-02 — End: 2015-10-04

## 2015-10-02 MED ORDER — CEFAZOLIN SODIUM-DEXTROSE 2-3 GM-% IV SOLR
2.0000 g | Freq: Four times a day (QID) | INTRAVENOUS | Status: AC
Start: 2015-10-02 — End: 2015-10-02
  Administered 2015-10-02 (×2): 2 g via INTRAVENOUS
  Filled 2015-10-02 (×2): qty 50

## 2015-10-02 MED ORDER — PNEUMOCOCCAL VAC POLYVALENT 25 MCG/0.5ML IJ INJ
0.5000 mL | INJECTION | INTRAMUSCULAR | Status: AC
  Administered 2015-10-04: 0.5 mL via INTRAMUSCULAR

## 2015-10-02 MED ORDER — METHOCARBAMOL 1000 MG/10ML IJ SOLN
500.0000 mg | Freq: Four times a day (QID) | INTRAVENOUS | Status: DC | PRN
  Filled 2015-10-02: qty 5

## 2015-10-02 MED ORDER — AMLODIPINE BESY-BENAZEPRIL HCL 5-10 MG PO CAPS: 1.0000 | ORAL_CAPSULE | Freq: Every day | ORAL | Status: DC

## 2015-10-02 MED ORDER — DEXTROSE 5 % IV SOLN
3.0000 g | Freq: Four times a day (QID) | INTRAVENOUS | Status: DC
  Filled 2015-10-02 (×2): qty 3000

## 2015-10-02 MED ORDER — PROPOFOL 500 MG/50ML IV EMUL
INTRAVENOUS | Status: DC | PRN
  Administered 2015-10-02: 50 ug/kg/min via INTRAVENOUS

## 2015-10-02 MED ORDER — BUPIVACAINE-EPINEPHRINE (PF) 0.5% -1:200000 IJ SOLN
INTRAMUSCULAR | Status: AC
  Filled 2015-10-02: qty 30

## 2015-10-02 MED ORDER — ONDANSETRON HCL 4 MG PO TABS: 4.0000 mg | ORAL_TABLET | Freq: Four times a day (QID) | ORAL | Status: DC | PRN

## 2015-10-02 MED ORDER — METOCLOPRAMIDE HCL 5 MG/ML IJ SOLN: 5.0000 mg | Freq: Three times a day (TID) | INTRAMUSCULAR | Status: DC | PRN

## 2015-10-02 MED ORDER — HYDROCODONE-ACETAMINOPHEN 5-325 MG PO TABS
1.0000 | ORAL_TABLET | ORAL | Status: DC | PRN
  Administered 2015-10-02: 1 via ORAL
  Administered 2015-10-02 – 2015-10-03 (×4): 2 via ORAL
  Filled 2015-10-02 (×3): qty 2
  Filled 2015-10-02: qty 1
  Filled 2015-10-02: qty 2

## 2015-10-02 SURGICAL SUPPLY — 69 items
BAG DECANTER FOR FLEXI CONT (MISCELLANEOUS) IMPLANT
BANDAGE ELASTIC 4 VELCRO ST LF (GAUZE/BANDAGES/DRESSINGS) ×3 IMPLANT
BANDAGE ESMARK 6X9 LF (GAUZE/BANDAGES/DRESSINGS) ×1 IMPLANT
BLADE SAGITTAL 25.0X1.19X90 (BLADE) ×2 IMPLANT
BLADE SAGITTAL 25.0X1.19X90MM (BLADE) ×1
BLADE SAW SGTL 13.0X1.19X90.0M (BLADE) ×3 IMPLANT
BLADE SURG ROTATE 9660 (MISCELLANEOUS) IMPLANT
BNDG ELASTIC 6X10 VLCR STRL LF (GAUZE/BANDAGES/DRESSINGS) ×3 IMPLANT
BNDG ESMARK 6X9 LF (GAUZE/BANDAGES/DRESSINGS) ×3
BNDG GAUZE ELAST 4 BULKY (GAUZE/BANDAGES/DRESSINGS) ×6 IMPLANT
BOWL SMART MIX CTS (DISPOSABLE) ×3 IMPLANT
CAP KNEE TOTAL 3 SIGMA ×3 IMPLANT
CEMENT HV SMART SET (Cement) ×6 IMPLANT
CLOSURE STERI-STRIP 1/4X4 (GAUZE/BANDAGES/DRESSINGS) ×3 IMPLANT
CLOSURE WOUND 1/2 X4 (GAUZE/BANDAGES/DRESSINGS) ×1
COVER SURGICAL LIGHT HANDLE (MISCELLANEOUS) ×3 IMPLANT
CUFF TOURNIQUET SINGLE 34IN LL (TOURNIQUET CUFF) ×3 IMPLANT
CUFF TOURNIQUET SINGLE 44IN (TOURNIQUET CUFF) IMPLANT
DECANTER SPIKE VIAL GLASS SM (MISCELLANEOUS) ×3 IMPLANT
DRAPE EXTREMITY T 121X128X90 (DRAPE) ×3 IMPLANT
DRAPE PROXIMA HALF (DRAPES) ×3 IMPLANT
DRAPE U-SHAPE 47X51 STRL (DRAPES) ×3 IMPLANT
DRSG ADAPTIC 3X8 NADH LF (GAUZE/BANDAGES/DRESSINGS) ×3 IMPLANT
DRSG PAD ABDOMINAL 8X10 ST (GAUZE/BANDAGES/DRESSINGS) ×6 IMPLANT
DURAPREP 26ML APPLICATOR (WOUND CARE) ×3 IMPLANT
ELECT REM PT RETURN 9FT ADLT (ELECTROSURGICAL) ×3
ELECTRODE REM PT RTRN 9FT ADLT (ELECTROSURGICAL) ×1 IMPLANT
FACESHIELD WRAPAROUND (MASK) ×3 IMPLANT
GAUZE SPONGE 4X4 12PLY STRL (GAUZE/BANDAGES/DRESSINGS) ×3 IMPLANT
GLOVE BIO SURGEON STRL SZ8 (GLOVE) ×6 IMPLANT
GLOVE BIOGEL PI IND STRL 6.5 (GLOVE) ×2 IMPLANT
GLOVE BIOGEL PI IND STRL 8 (GLOVE) ×2 IMPLANT
GLOVE BIOGEL PI INDICATOR 6.5 (GLOVE) ×4
GLOVE BIOGEL PI INDICATOR 8 (GLOVE) ×4
GLOVE SS N UNI LF 6.5 STRL (GLOVE) ×3 IMPLANT
GOWN STRL REUS W/ TWL LRG LVL3 (GOWN DISPOSABLE) ×2 IMPLANT
GOWN STRL REUS W/ TWL XL LVL3 (GOWN DISPOSABLE) ×2 IMPLANT
GOWN STRL REUS W/TWL LRG LVL3 (GOWN DISPOSABLE) ×4
GOWN STRL REUS W/TWL XL LVL3 (GOWN DISPOSABLE) ×4
HANDPIECE INTERPULSE COAX TIP (DISPOSABLE) ×2
HOOD PEEL AWAY FACE SHEILD DIS (HOOD) ×6 IMPLANT
IMMOBILIZER KNEE 22 UNIV (SOFTGOODS) ×3 IMPLANT
KIT BASIN OR (CUSTOM PROCEDURE TRAY) ×3 IMPLANT
KIT ROOM TURNOVER OR (KITS) ×3 IMPLANT
MANIFOLD NEPTUNE II (INSTRUMENTS) ×3 IMPLANT
NEEDLE HYPO 21X1 ECLIPSE (NEEDLE) ×3 IMPLANT
NS IRRIG 1000ML POUR BTL (IV SOLUTION) ×3 IMPLANT
PACK TOTAL JOINT (CUSTOM PROCEDURE TRAY) ×3 IMPLANT
PACK UNIVERSAL I (CUSTOM PROCEDURE TRAY) ×3 IMPLANT
PAD ARMBOARD 7.5X6 YLW CONV (MISCELLANEOUS) ×6 IMPLANT
SET HNDPC FAN SPRY TIP SCT (DISPOSABLE) ×1 IMPLANT
SPONGE GAUZE 4X4 12PLY STER LF (GAUZE/BANDAGES/DRESSINGS) ×3 IMPLANT
STAPLER VISISTAT 35W (STAPLE) IMPLANT
STRIP CLOSURE SKIN 1/2X4 (GAUZE/BANDAGES/DRESSINGS) ×2 IMPLANT
SUCTION FRAZIER HANDLE 10FR (MISCELLANEOUS)
SUCTION TUBE FRAZIER 10FR DISP (MISCELLANEOUS) IMPLANT
SUT MNCRL AB 3-0 PS2 18 (SUTURE) ×3 IMPLANT
SUT VIC AB 0 CT1 27 (SUTURE) ×4
SUT VIC AB 0 CT1 27XBRD ANBCTR (SUTURE) ×2 IMPLANT
SUT VIC AB 2-0 CT1 27 (SUTURE) ×4
SUT VIC AB 2-0 CT1 TAPERPNT 27 (SUTURE) ×2 IMPLANT
SUT VLOC 180 0 24IN GS25 (SUTURE) ×3 IMPLANT
SYR 50ML LL SCALE MARK (SYRINGE) ×3 IMPLANT
TIP HIGH FLOW IRRIGATION COAX (MISCELLANEOUS) ×3 IMPLANT
TOWEL OR 17X24 6PK STRL BLUE (TOWEL DISPOSABLE) ×3 IMPLANT
TOWEL OR 17X26 10 PK STRL BLUE (TOWEL DISPOSABLE) ×3 IMPLANT
TRAY FOLEY CATH 14FR (SET/KITS/TRAYS/PACK) IMPLANT
UPCHARGE REV TRAY MBT KNEE ×3 IMPLANT
WATER STERILE IRR 1000ML POUR (IV SOLUTION) ×6 IMPLANT

## 2015-10-02 NOTE — Anesthesia Postprocedure Evaluation (Signed)
Anesthesia Post Note  Patient: Matthew Sawyer  Procedure(s) Performed: Procedure(s) (LRB): RIGHT TOTAL KNEE ARTHROPLASTY (Right)  Patient location during evaluation: PACU Anesthesia Type: Spinal Level of consciousness: oriented and awake and alert Pain management: pain level controlled Vital Signs Assessment: post-procedure vital signs reviewed and stable Respiratory status: spontaneous breathing, respiratory function stable and patient connected to nasal cannula oxygen Cardiovascular status: blood pressure returned to baseline and stable Postop Assessment: no headache and no backache Anesthetic complications: no    Last Vitals:  Filed Vitals:   10/02/15 1530 10/02/15 1543  BP:  124/79  Pulse: 69 67  Temp:  36.9 C  Resp: 15 14    Last Pain: There were no vitals filed for this visit.               Reino Kent

## 2015-10-02 NOTE — Anesthesia Preprocedure Evaluation (Addendum)
Anesthesia Evaluation  Patient identified by MRN, date of birth, ID band Patient awake    Reviewed: Allergy & Precautions, H&P , NPO status , Patient's Chart, lab work & pertinent test results  History of Anesthesia Complications Negative for: history of anesthetic complications  Airway Mallampati: III  TM Distance: >3 FB Neck ROM: full   Comment: Large neck Dental no notable dental hx. (+) Teeth Intact   Pulmonary asthma ,    Pulmonary exam normal breath sounds clear to auscultation       Cardiovascular hypertension, Pt. on medications  Rhythm:regular Rate:Normal  EKG reviewed and RBBB   Neuro/Psych negative neurological ROS     GI/Hepatic Neg liver ROS, hiatal hernia,   Endo/Other  negative endocrine ROSMorbid obesity  Renal/GU negative Renal ROS     Musculoskeletal  (+) Arthritis ,   Abdominal   Peds  Hematology negative hematology ROS (+)   Anesthesia Other Findings   Reproductive/Obstetrics negative OB ROS                            Anesthesia Physical Anesthesia Plan  ASA: III  Anesthesia Plan: Spinal   Post-op Pain Management:    Induction: Intravenous  Airway Management Planned: Simple Face Mask  Additional Equipment:   Intra-op Plan:   Post-operative Plan:   Informed Consent: I have reviewed the patients History and Physical, chart, labs and discussed the procedure including the risks, benefits and alternatives for the proposed anesthesia with the patient or authorized representative who has indicated his/her understanding and acceptance.   Dental Advisory Given  Plan Discussed with: Anesthesiologist, CRNA and Surgeon  Anesthesia Plan Comments:        Anesthesia Quick Evaluation

## 2015-10-02 NOTE — Transfer of Care (Signed)
Immediate Anesthesia Transfer of Care Note  Patient: Matthew Sawyer  Procedure(s) Performed: Procedure(s): RIGHT TOTAL KNEE ARTHROPLASTY (Right)  Patient Location: PACU  Anesthesia Type:MAC and Spinal  Level of Consciousness: awake, alert , oriented and patient cooperative  Airway & Oxygen Therapy: Patient Spontanous Breathing and Patient connected to face mask oxygen  Post-op Assessment: Report given to RN and Post -op Vital signs reviewed and stable  Post vital signs: Reviewed and stable  Last Vitals:  Filed Vitals:   10/02/15 1027  BP: 138/83  Pulse: 75  Resp: 18    Complications: No apparent anesthesia complications

## 2015-10-02 NOTE — Op Note (Signed)
PREOP DIAGNOSIS: DJD RIGHT KNEE POSTOP DIAGNOSIS: same PROCEDURE: RIGHT TKR ANESTHESIA: Spinal ATTENDING SURGEON: Krishawn Vanderweele G ASSISTANT: Elodia Florence PA  INDICATIONS FOR PROCEDURE: Matthew Sawyer is a 55 y.o. male who has struggled for a long time with pain due to degenerative arthritis of the right knee.  The patient has failed many conservative non-operative measures and at this point has pain which limits the ability to sleep and walk.  The patient is offered total knee replacement.  Informed operative consent was obtained after discussion of possible risks of anesthesia, infection, neurovascular injury, DVT, and death.  The importance of the post-operative rehabilitation protocol to optimize result was stressed extensively with the patient.  SUMMARY OF FINDINGS AND PROCEDURE:  Matthew Sawyer was taken to the operative suite where under the above anesthesia a right knee replacement was performed.  There were advanced degenerative changes and the bone quality was excellent.  We used the DePuy LCS system and placed size standard plus femur, 4 MBT revision tibia, 38 mm all polyethylene patella, and a size 10 mm spacer.  Elodia Florence PA-C assisted throughout and was invaluable to the completion of the case in that he helped retract and maintain exposure while I placed components.  He also helped close thereby minimizing OR time.  The patient was admitted for appropriate post-op care to include perioperative antibiotics and mechanical and pharmacologic measures for DVT prophylaxis.  DESCRIPTION OF PROCEDURE:  Matthew Sawyer was taken to the operative suite where the above anesthesia was applied.  The patient was positioned supine and prepped and draped in normal sterile fashion.  An appropriate time out was performed.  After the administration of kefzol pre-op antibiotic the leg was elevated and exsanguinated and a tourniquet inflated. A standard longitudinal incision was made on the anterior knee.   Dissection was carried down to the extensor mechanism.  All appropriate anti-infective measures were used including the pre-operative antibiotic, betadine impregnated drape, and closed hooded exhaust systems for each member of the surgical team.  A medial parapatellar incision was made in the extensor mechanism and the knee cap flipped and the knee flexed.  Some residual meniscal tissues were removed along with any remaining ACL/PCL tissue.  A guide was placed on the tibia and a flat cut was made on it's superior surface.  An intramedullary guide was placed in the femur and was utilized to make anterior and posterior cuts creating an appropriate flexion gap.  A second intramedullary guide was placed in the femur to make a distal cut properly balancing the knee with an extension gap equal to the flexion gap.  The three bones sized to the above mentioned sizes and the appropriate guides were placed and utilized.  A trial reduction was done and the knee easily came to full extension and the patella tracked well on flexion.  The trial components were removed and all bones were cleaned with pulsatile lavage and then dried thoroughly.  Cement was mixed and was pressurized onto the bones followed by placement of the aforementioned components.  Excess cement was trimmed and pressure was held on the components until the cement had hardened.  The tourniquet was deflated and a small amount of bleeding was controlled with cautery and pressure.  The knee was irrigated thoroughly.  The extensor mechanism was re-approximated with V-loc suture in running fashion.  The knee was flexed and the repair was solid.  The subcutaneous tissues were re-approximated with #0 and #2-0 vicryl and the skin closed with a subcuticular stitch  and steristrips.  A sterile dressing was applied.  Intraoperative fluids, EBL, and tourniquet time can be obtained from anesthesia records.  DISPOSITION:  The patient was taken to recovery room in stable  condition and admitted for appropriate post-op care to include peri-operative antibiotic and DVT prophylaxis with mechanical and pharmacologic measures.  Luiz Trumpower G 10/02/2015, 1:46 PM

## 2015-10-02 NOTE — Progress Notes (Signed)
Orthopedic Tech Progress Note Patient Details:  Matthew Sawyer 06-Dec-1960 [id number] Ortho visit cpm set up and adjustment. Patient ID: Azul Brumett, male   DOB: May 14, 1961, 55 y.o.   MRN: [id number]   [name] 10/02/2015, 6:42 PM

## 2015-10-02 NOTE — Interval H&P Note (Signed)
OK for surgery PD 

## 2015-10-02 NOTE — Anesthesia Procedure Notes (Addendum)
Spinal Patient location during procedure: OR Staffing Anesthesiologist: FOSTER, MICHAEL Performed by: anesthesiologist  Preanesthetic Checklist Completed: patient identified, site marked, surgical consent, pre-op evaluation, timeout performed, IV checked, risks and benefits discussed and monitors and equipment checked Spinal Block Patient position: sitting Prep: site prepped and draped and DuraPrep Patient monitoring: heart rate, cardiac monitor, continuous pulse ox and blood pressure Approach: midline Location: L4-5 Injection technique: single-shot Needle Needle type: Sprotte and Whitacre  Needle gauge: 24 G Needle length: 12.7 cm Needle insertion depth: 10 cm Assessment Sensory level: T6 Additional Notes Patient tolerated procedure well. Adequate sensory level. Attempt x 3  Difficult due to positioning, MO and lack of landmarks.  Procedure Name: MAC Date/Time: 10/02/2015 12:22 PM Performed by: Rosiland Oz Pre-anesthesia Checklist: Patient identified, Timeout performed, Emergency Drugs available, Suction available and Patient being monitored Patient Re-evaluated:Patient Re-evaluated prior to induction

## 2015-10-02 NOTE — Progress Notes (Signed)
Orthopedic Tech Progress Note Patient Details:  Matthew Sawyer 19-Feb-1961 [id number]  CPM Right Knee CPM Right Knee: On Right Knee Flexion (Degrees): 90 Right Knee Extension (Degrees): 0 Additional Comments: trapeze bar patient helper Viewed order from doctor's order list  Nikki Dom 10/02/2015, 2:50 PM

## 2015-10-02 NOTE — Progress Notes (Signed)
Utilization review completed.  

## 2015-10-03 ENCOUNTER — Encounter (HOSPITAL_COMMUNITY): Payer: Self-pay | Admitting: Orthopaedic Surgery

## 2015-10-03 NOTE — Progress Notes (Signed)
Physical Therapy Treatment Patient Details Name: Matthew Sawyer MRN: [id number] DOB: 23-Jan-1961 Today's Date: 10/03/2015    History of Present Illness patient is a 55 yo male s/p RIGHT TOTAL KNEE ARTHROPLASTY     PT Comments    Patient seen for second session, increased activity tolerance and ambulation in halls this afternoon. Patient with good recall of there ex and ability to demonstrate technique from this am. Patient also able to verbalize mobility expectations and positioning for TKA. Will continue to see and progress as tolerated.   Follow Up Recommendations  Home health PT;Supervision/Assistance - 24 hour     Equipment Recommendations  None recommended by PT (patient reports he has RW)    Recommendations for Other Services       Precautions / Restrictions Precautions Precautions: Knee Precaution Comments: re-educated on precautions, including no pillow under knee  Restrictions Weight Bearing Restrictions: Yes RLE Weight Bearing: Weight bearing as tolerated    Mobility  Bed Mobility Overal bed mobility: Needs Assistance Bed Mobility: Sit to Supine       Sit to supine: Min assist   General bed mobility comments: Min assist for elevation and repositioning upon return to bed. Cues for self assist.  Transfers Overall transfer level: Needs assistance Equipment used: Rolling walker (2 wheeled) Transfers: Sit to/from Stand Sit to Stand: Min guard         General transfer comment: Min guard for safety, cues for hand placement, sequencing, and technique   Ambulation/Gait Ambulation/Gait assistance: Min guard Ambulation Distance (Feet): 160 Feet Assistive device: Rolling walker (2 wheeled) Gait Pattern/deviations: Decreased stride length;Step-to pattern;Shuffle;Antalgic;Trunk flexed Gait velocity: decreased Gait velocity interpretation: Below normal speed for age/gender General Gait Details: patient cued for step through gait, continued cues for safety with  positioning using RW. Improved speed but remains overall decreased. Several standing rest breaks   Stairs            Wheelchair Mobility    Modified Rankin (Stroke Patients Only)       Balance Overall balance assessment: Needs assistance Sitting-balance support: No upper extremity supported Sitting balance-Leahy Scale: Good     Standing balance support: Bilateral upper extremity supported Standing balance-Leahy Scale: Poor Standing balance comment: heavy reliance on RW                    Cognition Arousal/Alertness: Awake/alert Behavior During Therapy: WFL for tasks assessed/performed Overall Cognitive Status: Within Functional Limits for tasks assessed                      Exercises      General Comments        Pertinent Vitals/Pain Pain Assessment: 0-10 Pain Score: 8  Pain Location: right knee Pain Descriptors / Indicators: Operative site guarding;Sharp;Sore Pain Intervention(s): Monitored during session;Repositioned;Relaxation    Home Living Family/patient expects to be discharged to:: Private residence Living Arrangements: Non-relatives/Friends Available Help at Discharge: Friend(s) Type of Home: House Home Access: Stairs to enter Entrance Stairs-Rails: Can reach both Home Layout: One level Home Equipment: Environmental consultant - 2 wheels      Prior Function Level of Independence: Independent          PT Goals (current goals can now be found in the care plan section) Acute Rehab PT Goals Patient Stated Goal: to go home PT Goal Formulation: With patient Time For Goal Achievement: 10/17/15 Potential to Achieve Goals: Good Progress towards PT goals: Progressing toward goals    Frequency  7X/week  PT Plan Current plan remains appropriate    Co-evaluation             End of Session Equipment Utilized During Treatment: Gait belt Activity Tolerance: Patient tolerated treatment well Patient left: in bed;with call bell/phone within  reach;with bed alarm set     Time: 1510-1533 PT Time Calculation (min) (ACUTE ONLY): 23 min  Charges:  $Gait Training: 23-37 mins                    G CodesFabio Sawyer 10-04-15, 5:45 PM Matthew Sawyer, PT DPT  [phone]

## 2015-10-03 NOTE — Evaluation (Signed)
Occupational Therapy Evaluation Patient Details Name: Matthew Sawyer MRN: [id number] DOB: [birth year] Today's Date: 10/03/2015    History of Present Illness patient is a 55 yo male s/p RIGHT TOTAL KNEE ARTHROPLASTY    Clinical Impression   Patient presenting with decreased ADL and functional mobility independence secondary to above. Patient independent PTA. Patient currently functioning at an overall min to max assist level. Patient will benefit from acute OT to increase overall independence in the areas of ADLs, functional mobility, and overall safety in order to safely discharge home with assistance from friend prn. Pt reports he plans to sponge bath until stronger.     Follow Up Recommendations  No OT follow up;Supervision - Intermittent    Equipment Recommendations  3 in 1 bedside comode;Other (comment) (AE - reacher, sock aid, LH sponge)    Recommendations for Other Services  None at this time   Precautions / Restrictions Precautions Precautions: Knee Precaution Comments: educated on precautions, including no pillow under knee  Restrictions Weight Bearing Restrictions: Yes RLE Weight Bearing: Weight bearing as tolerated    Mobility Bed Mobility Overal bed mobility: Needs Assistance Bed Mobility: Supine to Sit     Supine to sit: Min assist     General bed mobility comments: pt found seated in recliner and left seated EOB at end of session  Transfers Overall transfer level: Needs assistance Equipment used: Rolling walker (2 wheeled) Transfers: Sit to/from Stand Sit to Stand: Min guard         General transfer comment: Min guard for safety, cues for hand placement, sequencing, and technique     Balance Overall balance assessment: Needs assistance Sitting-balance support: No upper extremity supported;Feet supported Sitting balance-Leahy Scale: Good     Standing balance support: Bilateral upper extremity supported;During functional activity Standing  balance-Leahy Scale: Poor Standing balance comment: heavy reliance on RW    ADL Overall ADL's : Needs assistance/impaired Eating/Feeding: Set up;Sitting   Grooming: Min guard;Standing   Upper Body Bathing: Set up;Sitting   Lower Body Bathing: Moderate assistance;Sit to/from stand   Upper Body Dressing : Set up;Sitting   Lower Body Dressing: Maximal assistance;Sit to/from stand   Toilet Transfer: Minimal assistance;RW;BSC;Ambulation   Toileting- Architect and Hygiene: Min guard;Sit to/from Nurse, children's Details (indicate cue type and reason): did not occur   General ADL Comments: Pt unable to reach BLEs for LB ADLs. Pt reports his friend can assist prior to going to work. Discussed AE and may bring in tomorrow to go over how he can use this to increase independence with LB ADLs. Also would like to go over how pt can use 3-n-1 in tub/shower for safety in shower.     Pertinent Vitals/Pain Pain Assessment: 0-10 Pain Score: 8  Pain Location: right knee Pain Descriptors / Indicators: Sore;Discomfort;Aching Pain Intervention(s): Limited activity within patient's tolerance;Monitored during session     Hand Dominance Right   Extremity/Trunk Assessment Upper Extremity Assessment Upper Extremity Assessment: Overall WFL for tasks assessed   Lower Extremity Assessment Lower Extremity Assessment: Defer to PT evaluation RLE: Unable to fully assess due to pain LLE Deficits / Details: LLE Ankle ROM deficits, uses CAM boot  LLE: Unable to fully assess due to pain LLE Coordination: decreased fine motor;decreased gross motor   Cervical / Trunk Assessment Cervical / Trunk Assessment: Normal   Communication Communication Communication: No difficulties   Cognition Arousal/Alertness: Awake/alert Behavior During Therapy: WFL for tasks assessed/performed Overall Cognitive Status: Within Functional Limits for tasks  assessed              Home Living  Family/patient expects to be discharged to:: Private residence Living Arrangements: Non-relatives/Friends Available Help at Discharge: Friend(s) Type of Home: House Home Access: Stairs to enter [employer] of Steps: 4 Entrance Stairs-Rails: Can reach both Home Layout: One level     Bathroom Shower/Tub: Tub/shower unit;Curtain   Firefighter: Standard     Home Equipment: Environmental consultant - 2 wheels   Prior Functioning/Environment Level of Independence: Independent     OT Diagnosis: Generalized weakness;Acute pain   OT Problem List: Decreased strength;Decreased range of motion;Decreased activity tolerance;Impaired balance (sitting and/or standing);Decreased safety awareness;Decreased knowledge of use of DME or AE;Decreased knowledge of precautions;Pain   OT Treatment/Interventions: Self-care/ADL training;Therapeutic exercise;DME and/or AE instruction;Therapeutic activities;Patient/family education;Balance training    OT Goals(Current goals can be found in the care plan section) Acute Rehab OT Goals Patient Stated Goal: to go home OT Goal Formulation: With patient Time For Goal Achievement: 10/17/15 Potential to Achieve Goals: Good ADL Goals Pt Will Perform Grooming: with modified independence;standing Pt Will Perform Lower Body Bathing: with modified independence;sit to/from stand;with adaptive equipment Pt Will Perform Lower Body Dressing: with modified independence;sit to/from stand;with adaptive equipment Pt Will Transfer to Toilet: with modified independence;ambulating;bedside commode Pt Will Perform Tub/Shower Transfer: Tub transfer;ambulating;3 in 1;rolling walker;with modified independence  OT Frequency: Min 2X/week   Barriers to D/C: Decreased caregiver support    End of Session Equipment Utilized During Treatment: Rolling walker CPM Right Knee CPM Right Knee: Off  Activity Tolerance: Patient tolerated treatment well Patient left: in bed;with call bell/phone  within reach (EOB)   Time: 1441-1500 OT Time Calculation (min): 19 minCharges:  OT General Charges $OT Visit: 1 Procedure OT Evaluation $OT Eval Low Complexity: 1 Procedure  Edwin Cap , MS, OTR/L, CLT Pager: [phone]  10/03/2015, 3:41 PM

## 2015-10-03 NOTE — Care Management Note (Signed)
Case Management Note  Patient Details  Name: Matthew Sawyer MRN: [id number] Date of Birth: 07/31/1961  Subjective/Objective:            S/p right total knee arthroplasty        Action/Plan: Spoke with patient about discharge plan, he selected [facility]. Contacted Jonea Bukowski at [place] and set up HHPT. Patient stated that he has a friend staying with him who will be able to assist him after discharge.[employer] will deliver CPM to home and 3N1 to his room. Patient stated that he already has a rolling walker at home.      Expected Discharge Date:                  Expected Discharge Plan:  Home w Home Health Services  In-House Referral:  NA  Discharge planning Services  CM Consult  Post Acute Care Choice:  Durable Medical Equipment, Home Health Choice offered to:  Patient  DME Arranged:  3-N-1, CPM DME Agency:  [employer]  HH Arranged:  PT HH Agency:  [facility]  Status of Service:  Completed, signed off  Medicare Important Message Given:    Date Medicare IM Given:    Medicare IM give by:    Date Additional Medicare IM Given:    Additional Medicare Important Message give by:     If discussed at Long Length of Stay Meetings, dates discussed:    Additional Comments:  Monica Becton, RN 10/03/2015, 3:24 PM

## 2015-10-03 NOTE — Progress Notes (Signed)
Subjective: 1 Day Post-Op Procedure(s) (LRB): RIGHT TOTAL KNEE ARTHROPLASTY (Right)  Activity level:  wbat Diet tolerance:  ok Voiding:  ok Patient reports pain as mild and moderate.    Objective: Vital signs in last 24 hours: Temp:  [98.2 F (36.8 C)-100.4 F (38 C)] 98.2 F (36.8 C) (03/01 0733) Pulse Rate:  [55-114] 114 (03/01 0500) Resp:  [13-18] 16 (03/01 0500) BP: (117-138)/(69-84) 117/72 mmHg (03/01 0500) SpO2:  [93 %-100 %] 95 % (03/01 0500) Weight:  [107.14 kg (236 lb 3.2 oz)] 107.14 kg (236 lb 3.2 oz) (02/28 1027)  Labs: No results for input(s): HGB in the last 72 hours. No results for input(s): WBC, RBC, HCT, PLT in the last 72 hours. No results for input(s): NA, K, CL, CO2, BUN, CREATININE, GLUCOSE, CALCIUM in the last 72 hours. No results for input(s): LABPT, INR in the last 72 hours.  Physical Exam:  Neurologically intact ABD soft Neurovascular intact Sensation intact distally Intact pulses distally Dorsiflexion/Plantar flexion intact Incision: dressing C/D/I and no drainage No cellulitis present Compartment soft  Assessment/Plan:  1 Day Post-Op Procedure(s) (LRB): RIGHT TOTAL KNEE ARTHROPLASTY (Right) Advance diet Up with therapy D/C IV fluids Plan for discharge tomorrow Discharge home with home health if doing well and cleared by PT. Follow up in office 2 weeks post op. I will change dressing to aquacel tomorrow. Continue on ASA  BID x 2 weeks post op.  [name], Ginger Organ 10/03/2015, 7:54 AM

## 2015-10-03 NOTE — Progress Notes (Signed)
Orthopedic Tech Progress Note Patient Details:  Matthew Sawyer [birth year] [id number] Ortho visit put on cpm at 1725. Patient ID: Matthew Sawyer, male   DOB: January 18, 1961, 55 y.o.   MRN: [id number]   [name] 10/03/2015, 5:24 PM

## 2015-10-03 NOTE — Evaluation (Signed)
Physical Therapy Evaluation Patient Details Name: Matthew Sawyer MRN: [id number] DOB: [birth year] Today's Date: 10/03/2015   History of Present Illness  patient is a 55 yo male s/p RIGHT TOTAL KNEE ARTHROPLASTY   Clinical Impression  Patient demonstrates deficits in functional mobility as indicated below. Will need continued skilled PT to address deficits and maximize function. Will see as indicated and progress as tolerated.  Educated patient regarding TKA precautions, activity expectations, and HEP. Performed some initial mobility and therapeutic exercises this session. Tolerated well. AROM -5 to 70 degress    Follow Up Recommendations Home health PT;Supervision/Assistance - 24 hour    Equipment Recommendations  None recommended by PT (patient reports he has RW)    Recommendations for Other Services       Precautions / Restrictions Precautions Precautions: Knee Precaution Comments: educated on precautions and HEP Restrictions Weight Bearing Restrictions: Yes RLE Weight Bearing: Weight bearing as tolerated      Mobility  Bed Mobility Overal bed mobility: Needs Assistance Bed Mobility: Supine to Sit     Supine to sit: Min assist     General bed mobility comments: educated on technique for self assist  Transfers Overall transfer level: Needs assistance Equipment used: Rolling walker (2 wheeled) Transfers: Sit to/from Stand Sit to Stand: Min assist         General transfer comment: VCs for safety with positioning and hand placement with use of RW  Ambulation/Gait Ambulation/Gait assistance: Min guard Ambulation Distance (Feet): 30 Feet Assistive device: Rolling walker (2 wheeled) Gait Pattern/deviations: Decreased stride length;Step-to pattern;Shuffle;Antalgic;Trunk flexed Gait velocity: decreased Gait velocity interpretation: <1.8 ft/sec, indicative of risk for recurrent falls General Gait Details: patient limited to 3-5 steps at a time secondary to increased  pain. Heavy reliance on UE support. Cues for postioning with sequencing of RW  Stairs            Wheelchair Mobility    Modified Rankin (Stroke Patients Only)       Balance Overall balance assessment: Needs assistance Sitting-balance support: Bilateral upper extremity supported Sitting balance-Leahy Scale: Fair     Standing balance support: Bilateral upper extremity supported;During functional activity Standing balance-Leahy Scale: Poor Standing balance comment: heavy reliance on UE support                             Pertinent Vitals/Pain Pain Assessment: 0-10 Pain Score: 6  Pain Location: Right knee, left ankle Pain Descriptors / Indicators: Operative site guarding;Sharp;Sore Pain Intervention(s): Repositioned;Limited activity within patient's tolerance;Monitored during session    Home Living Family/patient expects to be discharged to:: Private residence Living Arrangements: Non-relatives/Friends Available Help at Discharge: Friend(s) Type of Home: House Home Access: Stairs to enter Entrance Stairs-Rails: Can reach both Entrance Stairs-Number of Steps: 4 Home Layout: One level Home Equipment: Environmental consultant - 2 wheels      Prior Function Level of Independence: Independent               Hand Dominance   Dominant Hand: Right    Extremity/Trunk Assessment   Upper Extremity Assessment: Overall WFL for tasks assessed           Lower Extremity Assessment: RLE deficits/detail;LLE deficits/detail   LLE Deficits / Details: LLE Ankle ROM deficits, uses CAM boot      Communication   Communication: No difficulties  Cognition Arousal/Alertness: Awake/alert Behavior During Therapy: WFL for tasks assessed/performed Overall Cognitive Status: Within Functional Limits for tasks assessed  General Comments General comments (skin integrity, edema, etc.): Knee ROM 5-70 degrees AROM    Exercises Total Joint  Exercises Ankle Circles/Pumps: Both (performed continuously) Long Arc Quad: Right;10 reps Knee Flexion: Right;10 reps      Assessment/Plan    PT Assessment Patient needs continued PT services  PT Diagnosis Difficulty walking;Abnormality of gait;Generalized weakness;Acute pain   PT Problem List Decreased strength;Decreased range of motion;Decreased activity tolerance;Decreased balance;Decreased mobility;Decreased knowledge of use of DME;Decreased safety awareness;Pain  PT Treatment Interventions DME instruction;Gait training;Stair training;Functional mobility training;Therapeutic activities;Therapeutic exercise;Balance training;Patient/family education   PT Goals (Current goals can be found in the Care Plan section) Acute Rehab PT Goals Patient Stated Goal: to go home PT Goal Formulation: With patient Time For Goal Achievement: 10/17/15 Potential to Achieve Goals: Good    Frequency 7X/week   Barriers to discharge        Co-evaluation               End of Session Equipment Utilized During Treatment: Gait belt Activity Tolerance: Patient tolerated treatment well Patient left: in chair;with call bell/phone within reach;with chair alarm set Nurse Communication: Mobility status         Time: 1610-9604 PT Time Calculation (min) (ACUTE ONLY): 25 min   Charges:   PT Evaluation $PT Eval Moderate Complexity: 1 Procedure PT Treatments $Therapeutic Activity: 8-22 mins   PT G CodesFabio Asa October 09, 2015, 1:13 PM Charlotte Crumb, PT DPT  [phone]

## 2015-10-03 NOTE — Clinical Social Work Note (Signed)
CSW received referral for SNF.  Case discussed with case manager, and plan is to discharge home.  CSW to sign off please re-consult if social work needs arise.  Annarose Ouellet R. Taima Rada, MSW, LCSWA [phone]  

## 2015-10-04 MED ORDER — HYDROCODONE-ACETAMINOPHEN 5-325 MG PO TABS: 1.0000 | ORAL_TABLET | ORAL | Status: DC | PRN

## 2015-10-04 MED ORDER — ASPIRIN 325 MG PO TBEC: 325.0000 mg | DELAYED_RELEASE_TABLET | Freq: Two times a day (BID) | ORAL | Status: DC

## 2015-10-04 MED ORDER — METHOCARBAMOL 500 MG PO TABS: 500.0000 mg | ORAL_TABLET | Freq: Four times a day (QID) | ORAL | Status: DC | PRN

## 2015-10-04 NOTE — Progress Notes (Signed)
Discharged home with friend/family. Genevieve Norlander will provide HHPT. 9/1 delivered to room prior discharge.

## 2015-10-04 NOTE — Progress Notes (Signed)
Physical Therapy Treatment Patient Details Name: Matthew Sawyer MRN: [id number] DOB: 1960/12/27 Today's Date: 10/04/2015    History of Present Illness      PT Comments    Pt performed increased gait distance, HEP issued and therapeutic exercise performed.  PTA will perform stair training this pm in prep for d/c home.    Follow Up Recommendations  Home health PT;Supervision/Assistance - 24 hour     Equipment Recommendations  None recommended by PT    Recommendations for Other Services       Precautions / Restrictions Precautions Precautions: Knee Precaution Booklet Issued: Yes (comment) Precaution Comments: re-educated on precautions, including no pillow under knee  Restrictions Weight Bearing Restrictions: Yes RLE Weight Bearing: Weight bearing as tolerated    Mobility  Bed Mobility Overal bed mobility: Needs Assistance Bed Mobility: Supine to Sit     Supine to sit: Supervision     General bed mobility comments: Pt performed with use of rail and increased time.  Pt used hooking of LLE under R to advance to edge of bed.   Transfers Overall transfer level: Needs assistance Equipment used: Rolling walker (2 wheeled) Transfers: Sit to/from Stand Sit to Stand: Min guard         General transfer comment: Pt required min for safety, slow to ascend appears unsteady no LOB.  Cues to advance R leg forward to reduce pain and push with BUEs for safety.    Ambulation/Gait Ambulation/Gait assistance: Min guard Ambulation Distance (Feet): 180 Feet Assistive device: Rolling walker (2 wheeled) Gait Pattern/deviations: Step-through pattern;Step-to pattern;Decreased stride length;Decreased stance time - right;Decreased step length - left;Antalgic Gait velocity: decreased   General Gait Details: Pt demonstrated inconsistent step to and through gait patterns.  Pt required cues for gait symmetry, breathing, upper trunk control and R heel strike.     Stairs             Wheelchair Mobility    Modified Rankin (Stroke Patients Only)       Balance Overall balance assessment: Needs assistance Sitting-balance support: No upper extremity supported Sitting balance-Leahy Scale: Good       Standing balance-Leahy Scale: Fair                      Cognition Arousal/Alertness: Awake/alert Behavior During Therapy: WFL for tasks assessed/performed Overall Cognitive Status: Within Functional Limits for tasks assessed                      Exercises Total Joint Exercises Ankle Circles/Pumps: AROM;Both;Supine;20 reps (reclined) Quad Sets: AROM;Right;10 reps;Supine (reclined) Gluteal Sets: AROM;Both;10 reps;Supine (reclined) Heel Slides: AROM;Right;10 reps;Supine (reclined) Hip ABduction/ADduction: AROM;Right;10 reps;Supine (reclined) Straight Leg Raises: AROM;Right;10 reps;Supine (reclined) Long Arc Quad: AROM;Right;10 reps;Seated Other Exercises Other Exercises: Pt performed 2x30 sec holds to R quad/HS.  Pt performed with bed sheet to improve ROM.      General Comments        Pertinent Vitals/Pain Pain Score: 6  Pain Location: R knee Pain Descriptors / Indicators: Operative site guarding;Sore Pain Intervention(s): Monitored during session;Repositioned    Home Living                      Prior Function            PT Goals (current goals can now be found in the care plan section) Acute Rehab PT Goals Patient Stated Goal: to go home Potential to Achieve Goals: Good Progress towards PT goals: Progressing  toward goals    Frequency  7X/week    PT Plan Current plan remains appropriate    Co-evaluation             End of Session Equipment Utilized During Treatment: Gait belt Activity Tolerance: Patient tolerated treatment well Patient left: in chair;with call bell/phone within reach     Time: 0905-0940 PT Time Calculation (min) (ACUTE ONLY): 35 min  Charges:  $Gait Training: 8-22 mins $Therapeutic  Exercise: 8-22 mins                    G Codes:      Matthew Sawyer Oct 09, 2015, 9:58 AM

## 2015-10-04 NOTE — Discharge Summary (Signed)
Patient ID: Matthew Sawyer MRN: [id number] DOB/AGE: 55/18/1962 55 y.o.  Admit date: 10/02/2015 Discharge date: 10/04/2015  Admission Diagnoses:  Principal Problem:   Primary osteoarthritis of right knee   Discharge Diagnoses:  Same  Past Medical History  Diagnosis Date  . Back pain   . Capsulitis of foot   . Asthma   . Hypertension   . Prolapsed intervertebral disk   . Pneumonia   . History of hiatal hernia   . Constipation     Surgeries: Procedure(s): RIGHT TOTAL KNEE ARTHROPLASTY on 10/02/2015   Consultants:    Discharged Condition: Improved  Hospital Course: Lukis Bunt is an 55 y.o. male who was admitted 10/02/2015 for operative treatment ofPrimary osteoarthritis of right knee. Patient has severe unremitting pain that affects sleep, daily activities, and work/hobbies. After pre-op clearance the patient was taken to the operating room on 10/02/2015 and underwent  Procedure(s): RIGHT TOTAL KNEE ARTHROPLASTY.    Patient was given perioperative antibiotics: Anti-infectives    Start     Dose/Rate Route Frequency Ordered Stop   10/02/15 1800  ceFAZolin (ANCEF) IVPB 2 g/50 mL premix     2 g 100 mL/hr over 30 Minutes Intravenous Every 6 hours 10/02/15 1652 10/02/15 2332   10/02/15 1730  ceFAZolin (ANCEF) 3 g in dextrose 5 % 50 mL IVPB  Status:  Discontinued     3 g 130 mL/hr over 30 Minutes Intravenous Every 6 hours 10/02/15 1604 10/02/15 1652   10/02/15 1045  ceFAZolin (ANCEF) 3 g in dextrose 5 % 50 mL IVPB     3 g 130 mL/hr over 30 Minutes Intravenous To Surgery 10/02/15 1024 10/02/15 1235       Patient was given sequential compression devices, early ambulation, and chemoprophylaxis to prevent DVT.  Patient benefited maximally from hospital stay and there were no complications.    Recent vital signs: Patient Vitals for the past 24 hrs:  BP Temp Temp src Pulse Resp SpO2  10/04/15 0300 122/76 mmHg 98.8 F (37.1 C) Oral (!) 113 18 97 %  10/03/15 1900 (!) 103/59  mmHg 100.3 F (37.9 C) Oral (!) 125 18 98 %  10/03/15 1430 107/75 mmHg 99.1 F (37.3 C) - (!) 115 18 97 %     Recent laboratory studies: No results for input(s): WBC, HGB, HCT, PLT, NA, K, CL, CO2, BUN, CREATININE, GLUCOSE, INR, CALCIUM in the last 72 hours.  Invalid input(s): PT, 2   Discharge Medications:     Medication List    STOP taking these medications        meloxicam 7.5 MG tablet  Commonly known as:  MOBIC      TAKE these medications        albuterol 108 (90 Base) MCG/ACT inhaler  Commonly known as:  PROVENTIL HFA;VENTOLIN HFA  Inhale 2 puffs into the lungs every 6 (six) hours as needed for wheezing or shortness of breath.     amLODipine-benazepril 5-10 MG capsule  Commonly known as:  LOTREL  Take 1 capsule by mouth daily.     aspirin 325 MG EC tablet  Take 1 tablet (325 mg total) by mouth 2 (two) times daily after a meal.     HYDROcodone-acetaminophen 5-325 MG tablet  Commonly known as:  NORCO/VICODIN  Take 1-2 tablets by mouth every 4 (four) hours as needed (breakthrough pain).     methocarbamol 500 MG tablet  Commonly known as:  ROBAXIN  Take 1 tablet (500 mg total) by mouth every 6 (six) hours  as needed for muscle spasms.     theophylline 300 MG 12 hr tablet  Commonly known as:  THEODUR  Take 300 mg by mouth 2 (two) times daily.        Diagnostic Studies: Dg Chest 2 View  09/20/2015  CLINICAL DATA:  Preoperative examination prior to right knee joint replacement. History of hypertension, obesity, asthma, nonsmoker. EXAM: CHEST  2 VIEW COMPARISON:  PA and lateral chest x-ray dated January 05, 2009 FINDINGS: The lungs are adequately inflated. The interstitial markings are coarse. Retrocardiac density persists and likely reflect scarring or bronchiectasis. The heart is normal in size. The pulmonary vascularity is not engorged. The mediastinum is normal in width. There are degenerative changes of both shoulders. There is mild degenerative disc space narrowing of  the thoracic spine. IMPRESSION: Chronic interstitial changes and left basilar fibrosis consistent with known reactive airway disease. There is no acute cardiopulmonary abnormality. Electronically Signed   By: David  [place] M.D.   On: 09/20/2015 16:03   Dg Ankle Complete Right  09/04/2015  CLINICAL DATA:  Larey Seat.  Right ankle pain. EXAM: RIGHT ANKLE - COMPLETE 3+ VIEW COMPARISON:  None. FINDINGS: Advanced tibiotalar and subtalar degenerative changes with pes planus. The talus has a somewhat vertical appearance on the lateral film. No acute fracture or osteochondral lesion. Vascular calcifications are noted. IMPRESSION: Degenerative changes as described above but no acute fracture. Electronically Signed   By: Rudie Meyer M.D.   On: 09/04/2015 18:08    Disposition: 01-Home or Self Care      Discharge Instructions    Call MD / Call 911    Complete by:  As directed   If you experience chest pain or shortness of breath, CALL 911 and be transported to the hospital emergency room.  If you develope a fever above 101 F, pus (white drainage) or increased drainage or redness at the wound, or calf pain, call your surgeon's office.     Constipation Prevention    Complete by:  As directed   Drink plenty of fluids.  Prune juice may be helpful.  You may use a stool softener, such as Colace (over the counter) 100 mg twice a day.  Use MiraLax (over the counter) for constipation as needed.     Diet - low sodium heart healthy    Complete by:  As directed      Discharge instructions    Complete by:  As directed   INSTRUCTIONS AFTER JOINT REPLACEMENT   Remove items at home which could result in a fall. This includes throw rugs or furniture in walking pathways ICE to the affected joint every three hours while awake for 30 minutes at a time, for at least the first 3-5 days, and then as needed for pain and swelling.  Continue to use ice for pain and swelling. You may notice swelling that will progress down to the foot  and ankle.  This is normal after surgery.  Elevate your leg when you are not up walking on it.   Continue to use the breathing machine you got in the hospital (incentive spirometer) which will help keep your temperature down.  It is common for your temperature to cycle up and down following surgery, especially at night when you are not up moving around and exerting yourself.  The breathing machine keeps your lungs expanded and your temperature down.   DIET:  As you were doing prior to hospitalization, we recommend a well-balanced diet.  DRESSING / WOUND CARE /  SHOWERING  You may shower 3 days after surgery, but keep the wounds dry during showering.  You may use an occlusive plastic wrap (Press'n Seal for example), NO SOAKING/SUBMERGING IN THE BATHTUB.  If the bandage gets wet, change with a clean dry gauze.  If the incision gets wet, pat the wound dry with a clean towel.  ACTIVITY  Increase activity slowly as tolerated, but follow the weight bearing instructions below.   No driving for 6 weeks or until further direction given by your physician.  You cannot drive while taking narcotics.  No lifting or carrying greater than 10 lbs. until further directed by your surgeon. Avoid periods of inactivity such as sitting longer than an hour when not asleep. This helps prevent blood clots.  You may return to work once you are authorized by your doctor.     WEIGHT BEARING   Weight bearing as tolerated with assist device (walker, cane, etc) as directed, use it as long as suggested by your surgeon or therapist, typically at least 4-6 weeks.   EXERCISES  Results after joint replacement surgery are often greatly improved when you follow the exercise, range of motion and muscle strengthening exercises prescribed by your doctor. Safety measures are also important to protect the joint from further injury. Any time any of these exercises cause you to have increased pain or swelling, decrease what you are  doing until you are comfortable again and then slowly increase them. If you have problems or questions, call your caregiver or physical therapist for advice.   Rehabilitation is important following a joint replacement. After just a few days of immobilization, the muscles of the leg can become weakened and shrink (atrophy).  These exercises are designed to build up the tone and strength of the thigh and leg muscles and to improve motion. Often times heat used for twenty to thirty minutes before working out will loosen up your tissues and help with improving the range of motion but do not use heat for the first two weeks following surgery (sometimes heat can increase post-operative swelling).   These exercises can be done on a training (exercise) mat, on the floor, on a table or on a bed. Use whatever works the best and is most comfortable for you.    Use music or television while you are exercising so that the exercises are a pleasant break in your day. This will make your life better with the exercises acting as a break in your routine that you can look forward to.   Perform all exercises about fifteen times, three times per day or as directed.  You should exercise both the operative leg and the other leg as well.   Exercises include:   Quad Sets - Tighten up the muscle on the front of the thigh (Quad) and hold for 5-10 seconds.   Straight Leg Raises - With your knee straight (if you were given a brace, keep it on), lift the leg to 60 degrees, hold for 3 seconds, and slowly lower the leg.  Perform this exercise against resistance later as your leg gets stronger.  Leg Slides: Lying on your back, slowly slide your foot toward your buttocks, bending your knee up off the floor (only go as far as is comfortable). Then slowly slide your foot back down until your leg is flat on the floor again.  Angel Wings: Lying on your back spread your legs to the side as far apart as you can without causing discomfort.  Hamstring Strength:  Lying on your back, push your heel against the floor with your leg straight by tightening up the muscles of your buttocks.  Repeat, but this time bend your knee to a comfortable angle, and push your heel against the floor.  You may put a pillow under the heel to make it more comfortable if necessary.   A rehabilitation program following joint replacement surgery can speed recovery and prevent re-injury in the future due to weakened muscles. Contact your doctor or a physical therapist for more information on knee rehabilitation.    CONSTIPATION  Constipation is defined medically as fewer than three stools per week and severe constipation as less than one stool per week.  Even if you have a regular bowel pattern at home, your normal regimen is likely to be disrupted due to multiple reasons following surgery.  Combination of anesthesia, postoperative narcotics, change in appetite and fluid intake all can affect your bowels.   YOU MUST use at least one of the following options; they are listed in order of increasing strength to get the job done.  They are all available over the counter, and you may need to use some, POSSIBLY even all of these options:    Drink plenty of fluids (prune juice may be helpful) and high fiber foods Colace 100 mg by mouth twice a day  Senokot for constipation as directed and as needed Dulcolax (bisacodyl), take with full glass of water  Miralax (polyethylene glycol) once or twice a day as needed.  If you have tried all these things and are unable to have a bowel movement in the first 3-4 days after surgery call either your surgeon or your primary doctor.    If you experience loose stools or diarrhea, hold the medications until you stool forms back up.  If your symptoms do not get better within 1 week or if they get worse, check with your doctor.  If you experience "the worst abdominal pain ever" or develop nausea or vomiting, please contact the office  immediately for further recommendations for treatment.   ITCHING:  If you experience itching with your medications, try taking only a single pain pill, or even half a pain pill at a time.  You can also use Benadryl over the counter for itching or also to help with sleep.   TED HOSE STOCKINGS:  Use stockings on both legs until for at least 2 weeks or as directed by physician office. They may be removed at night for sleeping.  MEDICATIONS:  See your medication summary on the "After Visit Summary" that nursing will review with you.  You may have some home medications which will be placed on hold until you complete the course of blood thinner medication.  It is important for you to complete the blood thinner medication as prescribed.  PRECAUTIONS:  If you experience chest pain or shortness of breath - call 911 immediately for transfer to the hospital emergency department.   If you develop a fever greater that 101 F, purulent drainage from wound, increased redness or drainage from wound, foul odor from the wound/dressing, or calf pain - CONTACT YOUR SURGEON.                                                   FOLLOW-UP APPOINTMENTS:  If you do not already have  a post-op appointment, please call the office for an appointment to be seen by your surgeon.  Guidelines for how soon to be seen are listed in your "After Visit Summary", but are typically between 1-4 weeks after surgery.  OTHER INSTRUCTIONS:   Knee Replacement:  Do not place pillow under knee, focus on keeping the knee straight while resting. CPM instructions: 0-90 degrees, 2 hours in the morning, 2 hours in the afternoon, and 2 hours in the evening. Place foam block, curve side up under heel at all times except when in CPM or when walking.  DO NOT modify, tear, cut, or change the foam block in any way.  MAKE SURE YOU:  Understand these instructions.  Get help right away if you are not doing well or get worse.    Thank you for letting us be a  part of your medical care team.  It is a privilege we respect greatly.  We hope these instructions will help you stay on track for a fast and full recovery!     Increase activity slowly as tolerated    Complete by:  As directed            Follow-up Information    Follow up with Velna Ochs, MD. Schedule an appointment as soon as possible for a visit in 2 weeks.   Specialty:  Orthopedic Surgery   Contact information:   [address] [place] [address] [phone]       Follow up with [facility]   Why:  They will contact you to schedule home therapy visits.   Contact information:   [address] [place] [address] [phone]        Signed: Drema Halon 10/04/2015, 2:01 PM

## 2015-10-04 NOTE — Progress Notes (Signed)
Physical Therapy Treatment Patient Details Name: Matthew Sawyer MRN: [id number] DOB: 03/27/1961 Today's Date: 10/04/2015    History of Present Illness patient is a 55 yo male s/p RIGHT TOTAL KNEE ARTHROPLASTY     PT Comments    Pt reports increased pain in L foot post stair training requiring increased assist for transfer from 3:1 to commode to roll back to room.  Pt reports chronic pain in L foot.  Pt demonstrated increased gait distance and used safe technique for gait and transfer activities.  Pt educated on HEP at d/c.  Pt able to teach back technique to PTA.    Follow Up Recommendations  Home health PT;Supervision/Assistance - 24 hour     Equipment Recommendations  None recommended by PT    Recommendations for Other Services       Precautions / Restrictions Precautions Precautions: Knee Precaution Comments: re-educated on precautions, including no pillow under knee  Restrictions RLE Weight Bearing: Weight bearing as tolerated    Mobility  Bed Mobility Overal bed mobility: Needs Assistance Bed Mobility: Supine to Sit     Supine to sit: Supervision     General bed mobility comments: Pt performed with use of rail and increased time.  Pt used hooking of LLE under R to advance to edge of bed.   Transfers Overall transfer level: Needs assistance Equipment used: Rolling walker (2 wheeled) Transfers: Sit to/from Stand Sit to Stand: Supervision;Min assist         General transfer comment: Pt required S for sit to stand until pain in L foot increased and pt required squat pivot from 3:1 to chair.    Ambulation/Gait Ambulation/Gait assistance: Min guard Ambulation Distance (Feet): 218 Feet Assistive device: Rolling walker (2 wheeled)       General Gait Details: Pt demonstrated inconsistent step to and through gait patterns.  Pt required cues for gait symmetry, breathing, upper trunk control and R heel strike.    Stairs   Stairs assistance: Min assist Stair  Management: No rails Number of Stairs: 3 General stair comments: Pt required visual demonstration pre-trial.  Pt required min assist with verbal cues for sequencing and RW placement.    Wheelchair Mobility    Modified Rankin (Stroke Patients Only)       Balance     Sitting balance-Leahy Scale: Good       Standing balance-Leahy Scale: Fair                      Cognition Arousal/Alertness: Awake/alert Behavior During Therapy: WFL for tasks assessed/performed Overall Cognitive Status: Within Functional Limits for tasks assessed                      Exercises Other Exercises Other Exercises: Reviewed HEP with pt, pt able to report technique back to therapist in teach back method.      General Comments        Pertinent Vitals/Pain Pain Assessment: 0-10 Pain Score: 6  Pain Location: reports pain in R knee 6/10 and pain in L foot 4/10.  Reports L foot feeling like its going to give away. Pain Descriptors / Indicators: Sore    Home Living                      Prior Function            PT Goals (current goals can now be found in the care plan section) Acute Rehab PT Goals  Potential to Achieve Goals: Good Progress towards PT goals: Progressing toward goals    Frequency  7X/week    PT Plan Current plan remains appropriate    Co-evaluation             End of Session Equipment Utilized During Treatment: Gait belt Activity Tolerance: Patient tolerated treatment well Patient left: in chair;with call bell/phone within reach     Time: 1357-1423 PT Time Calculation (min) (ACUTE ONLY): 26 min  Charges:  $Gait Training: 8-22 mins $Therapeutic Activity: 8-22 mins                    G Codes:      Florestine Avers 11/03/15, 2:34 PM  Joycelyn Rua, PTA pager [phone]

## 2015-10-04 NOTE — Progress Notes (Signed)
Orthopedic Tech Progress Note Patient Details:  Matthew Sawyer 11/25/1960 [id number]  Patient ID: Matthew Sawyer, male   DOB: [birth year], 55 y.o.   MRN: [id number] Pt. On cpm 0-90   Trinna Post 10/04/2015, 6:03 AM

## 2015-11-08 DIAGNOSIS — Z471 Aftercare following joint replacement surgery: Secondary | ICD-10-CM | POA: Diagnosis not present

## 2015-11-08 DIAGNOSIS — R262 Difficulty in walking, not elsewhere classified: Secondary | ICD-10-CM | POA: Diagnosis not present

## 2015-11-08 DIAGNOSIS — M25561 Pain in right knee: Secondary | ICD-10-CM | POA: Diagnosis not present

## 2015-11-08 DIAGNOSIS — Z96651 Presence of right artificial knee joint: Secondary | ICD-10-CM | POA: Diagnosis not present

## 2015-11-15 DIAGNOSIS — M25561 Pain in right knee: Secondary | ICD-10-CM | POA: Diagnosis not present

## 2015-11-15 DIAGNOSIS — Z471 Aftercare following joint replacement surgery: Secondary | ICD-10-CM | POA: Diagnosis not present

## 2015-11-15 DIAGNOSIS — R262 Difficulty in walking, not elsewhere classified: Secondary | ICD-10-CM | POA: Diagnosis not present

## 2015-11-15 DIAGNOSIS — Z96651 Presence of right artificial knee joint: Secondary | ICD-10-CM | POA: Diagnosis not present

## 2015-11-22 DIAGNOSIS — R262 Difficulty in walking, not elsewhere classified: Secondary | ICD-10-CM | POA: Diagnosis not present

## 2015-11-22 DIAGNOSIS — Z471 Aftercare following joint replacement surgery: Secondary | ICD-10-CM | POA: Diagnosis not present

## 2015-11-22 DIAGNOSIS — Z96651 Presence of right artificial knee joint: Secondary | ICD-10-CM | POA: Diagnosis not present

## 2015-11-22 DIAGNOSIS — M25561 Pain in right knee: Secondary | ICD-10-CM | POA: Diagnosis not present

## 2015-11-27 DIAGNOSIS — Z125 Encounter for screening for malignant neoplasm of prostate: Secondary | ICD-10-CM | POA: Diagnosis not present

## 2015-11-27 DIAGNOSIS — E669 Obesity, unspecified: Secondary | ICD-10-CM | POA: Diagnosis not present

## 2015-11-27 DIAGNOSIS — J45909 Unspecified asthma, uncomplicated: Secondary | ICD-10-CM | POA: Diagnosis not present

## 2015-11-27 DIAGNOSIS — Z0189 Encounter for other specified special examinations: Secondary | ICD-10-CM | POA: Diagnosis not present

## 2015-11-28 DIAGNOSIS — M25561 Pain in right knee: Secondary | ICD-10-CM | POA: Diagnosis not present

## 2015-12-03 DIAGNOSIS — R262 Difficulty in walking, not elsewhere classified: Secondary | ICD-10-CM | POA: Diagnosis not present

## 2015-12-03 DIAGNOSIS — M25561 Pain in right knee: Secondary | ICD-10-CM | POA: Diagnosis not present

## 2015-12-03 DIAGNOSIS — Z471 Aftercare following joint replacement surgery: Secondary | ICD-10-CM | POA: Diagnosis not present

## 2015-12-03 DIAGNOSIS — Z96651 Presence of right artificial knee joint: Secondary | ICD-10-CM | POA: Diagnosis not present

## 2015-12-06 DIAGNOSIS — Z96651 Presence of right artificial knee joint: Secondary | ICD-10-CM | POA: Diagnosis not present

## 2015-12-06 DIAGNOSIS — Z411 Encounter for cosmetic surgery: Secondary | ICD-10-CM | POA: Diagnosis not present

## 2015-12-06 DIAGNOSIS — M25561 Pain in right knee: Secondary | ICD-10-CM | POA: Diagnosis not present

## 2015-12-06 DIAGNOSIS — R262 Difficulty in walking, not elsewhere classified: Secondary | ICD-10-CM | POA: Diagnosis not present

## 2015-12-11 DIAGNOSIS — M2141 Flat foot [pes planus] (acquired), right foot: Secondary | ICD-10-CM | POA: Diagnosis not present

## 2015-12-11 DIAGNOSIS — M2142 Flat foot [pes planus] (acquired), left foot: Secondary | ICD-10-CM | POA: Diagnosis not present

## 2015-12-11 DIAGNOSIS — M6701 Short Achilles tendon (acquired), right ankle: Secondary | ICD-10-CM | POA: Diagnosis not present

## 2015-12-11 DIAGNOSIS — M19072 Primary osteoarthritis, left ankle and foot: Secondary | ICD-10-CM | POA: Diagnosis not present

## 2015-12-11 DIAGNOSIS — M6702 Short Achilles tendon (acquired), left ankle: Secondary | ICD-10-CM | POA: Diagnosis not present

## 2015-12-26 DIAGNOSIS — M25561 Pain in right knee: Secondary | ICD-10-CM | POA: Diagnosis not present

## 2016-04-23 DIAGNOSIS — M25561 Pain in right knee: Secondary | ICD-10-CM | POA: Diagnosis not present

## 2016-05-28 DIAGNOSIS — I10 Essential (primary) hypertension: Secondary | ICD-10-CM | POA: Diagnosis not present

## 2016-05-28 DIAGNOSIS — M25569 Pain in unspecified knee: Secondary | ICD-10-CM | POA: Diagnosis not present

## 2016-05-28 DIAGNOSIS — J4531 Mild persistent asthma with (acute) exacerbation: Secondary | ICD-10-CM | POA: Diagnosis not present

## 2016-05-28 DIAGNOSIS — M545 Low back pain: Secondary | ICD-10-CM | POA: Diagnosis not present

## 2016-06-16 ENCOUNTER — Other Ambulatory Visit: Payer: Self-pay | Admitting: Family Medicine

## 2016-06-16 DIAGNOSIS — I10 Essential (primary) hypertension: Secondary | ICD-10-CM

## 2016-08-13 DIAGNOSIS — M2142 Flat foot [pes planus] (acquired), left foot: Secondary | ICD-10-CM | POA: Diagnosis not present

## 2016-08-13 DIAGNOSIS — G8929 Other chronic pain: Secondary | ICD-10-CM | POA: Diagnosis not present

## 2016-08-13 DIAGNOSIS — M79671 Pain in right foot: Secondary | ICD-10-CM | POA: Diagnosis not present

## 2016-08-13 DIAGNOSIS — M2141 Flat foot [pes planus] (acquired), right foot: Secondary | ICD-10-CM | POA: Diagnosis not present

## 2016-08-13 DIAGNOSIS — M6701 Short Achilles tendon (acquired), right ankle: Secondary | ICD-10-CM | POA: Diagnosis not present

## 2016-08-13 DIAGNOSIS — M79672 Pain in left foot: Secondary | ICD-10-CM | POA: Diagnosis not present

## 2016-08-13 DIAGNOSIS — M6702 Short Achilles tendon (acquired), left ankle: Secondary | ICD-10-CM | POA: Diagnosis not present

## 2016-10-17 ENCOUNTER — Ambulatory Visit (INDEPENDENT_AMBULATORY_CARE_PROVIDER_SITE_OTHER): Payer: Federal, State, Local not specified - PPO | Admitting: Family Medicine

## 2016-10-17 VITALS — BP 118/80 | HR 77 | Temp 98.2°F | Resp 16 | Ht 63.0 in | Wt 251.0 lb

## 2016-10-17 DIAGNOSIS — I10 Essential (primary) hypertension: Secondary | ICD-10-CM

## 2016-10-17 DIAGNOSIS — Z131 Encounter for screening for diabetes mellitus: Secondary | ICD-10-CM

## 2016-10-17 DIAGNOSIS — J4540 Moderate persistent asthma, uncomplicated: Secondary | ICD-10-CM

## 2016-10-17 MED ORDER — MONTELUKAST SODIUM 10 MG PO TABS: 10.0000 mg | ORAL_TABLET | Freq: Every day | ORAL | 6 refills | Status: AC

## 2016-10-17 MED ORDER — AMLODIPINE BESY-BENAZEPRIL HCL 5-10 MG PO CAPS: 1.0000 | ORAL_CAPSULE | Freq: Every day | ORAL | 5 refills | Status: DC

## 2016-10-17 NOTE — Patient Instructions (Signed)
     IF you received an x-ray today, you will receive an invoice from [facility]. Please contact [facility] at [phone] with questions or concerns regarding your invoice.   IF you received labwork today, you will receive an invoice from [facility]. Please contact [facility] at [phone] with questions or concerns regarding your invoice.   Our billing staff will not be able to assist you with questions regarding bills from these companies.  You will be contacted with the lab results as soon as they are available. The fastest way to get your results is to activate your My Chart account. Instructions are located on the last page of this paperwork. If you have not heard from us regarding the results in 2 weeks, please contact this office.     

## 2016-10-17 NOTE — Addendum Note (Signed)
Addended by: Isaac BlissGALLOWAY, Cortavius Montesinos J on: 10/17/2016 11:38 AM   Modules accepted: Orders

## 2016-10-17 NOTE — Progress Notes (Signed)
Chief Complaint  Patient presents with  . Medication Refill    Amlodipine    HPI   Hypertension: Patient here for follow-up of elevated blood pressure. He is not exercising and is adherent to low salt diet.  Blood pressure is well controlled at home. Cardiac symptoms none. Patient denies chest pain, exertional chest pressure/discomfort, irregular heart beat, orthopnea and palpitations.  Cardiovascular risk factors: advanced age (older than 1155 for men, 8465 for women), hypertension and male gender. Use of agents associated with hypertension: none. History of target organ damage: none. BP Readings from Last 3 Encounters:  10/17/16 118/80  10/04/15 122/76  09/20/15 133/84  [facility]  Asthma Albuterol inhaler is his only medication He reports that with a bad episode he needs albuterol bid In the past month he has used albuterol 6-7 times He reports some nighttime symptoms He is affected by the seasonal changes He reports that in the past two weeks he has had to use his inhaler 3-4 times  Past Medical History:  Diagnosis Date  . Asthma   . Back pain   . Capsulitis of foot   . Constipation   . History of hiatal hernia   . Hypertension   . Pneumonia   . Prolapsed intervertebral disk     Current Outpatient Prescriptions  Medication Sig Dispense Refill  . albuterol (PROVENTIL HFA;VENTOLIN HFA) 108 (90 BASE) MCG/ACT inhaler Inhale 2 puffs into the lungs every 6 (six) hours as needed for wheezing or shortness of breath.     Marland Kitchen. amLODipine-benazepril (LOTREL) 5-10 MG capsule Take 1 capsule by mouth daily. 30 capsule 5  . montelukast (SINGULAIR) 10 MG tablet Take 1 tablet (10 mg total) by mouth at bedtime. 30 tablet 6   No current facility-administered medications for this visit.     Allergies: No Known Allergies  Past Surgical History:  Procedure Laterality Date  . CARPAL TUNNEL RELEASE Bilateral   . COLONOSCOPY    . HERNIA REPAIR     navel  . TOTAL  KNEE ARTHROPLASTY Right 10/02/2015   Procedure: RIGHT TOTAL KNEE ARTHROPLASTY;  Surgeon: Marcene CorningPeter Dalldorf, MD;  Location: [facility] OR;  Service: Orthopedics;  Laterality: Right;    Social History   Social History  . Marital status: Single    Spouse name: N/A  . Number of children: N/A  . Years of education: N/A   Social History Main Topics  . Smoking status: Never Smoker  . Smokeless tobacco: Never Used  . Alcohol use 0.0 oz/week     Comment: occasional  . Drug use: No  . Sexual activity: Yes    Birth control/ protection: None   Other Topics Concern  . None   Social History Narrative  . None    ROS  Objective: Vitals:   10/17/16 0855  BP: 118/80  Pulse: 77  Resp: 16  Temp: 98.2 F (36.8 C)  TempSrc: Oral  SpO2: 97%  Weight: 251 lb (113.9 kg)  Height: 5\' 3"  (1.6 m)  Body mass index is 44.46 kg/m.   Physical Exam General: alert, oriented, in NAD, walks with cane Head: normocephalic, atraumatic, no sinus tenderness Eyes: EOM intact, no scleral icterus or conjunctival injection Ears: TM clear bilaterally Throat: no pharyngeal exudate or erythema Lymph: no posterior auricular, submental or cervical lymph adenopathy Heart: normal rate, normal sinus rhythm, no murmurs Lungs: clear to auscultation bilaterally, no wheezing Extremities: no lower extremity edema  Assessment and Plan Alon was seen today for  medication refill.  Diagnoses and all orders for this visit:  Essential hypertension- discussed med monitoring, discussed complaince and pt is compliant with meds -     Comprehensive metabolic panel -     Lipid panel -     amLODipine-benazepril (LOTREL) 5-10 MG capsule; Take 1 capsule by mouth daily.  Morbid obesity (HCC)- discussed that his high BMI is a risk factor for heart disease -     Lipid panel  Screening for diabetes mellitus -     Hemoglobin A1c  Asthma, moderate persistent, well-controlled Pt given script for singular He plans to go to [place] for  this medication -     montelukast (SINGULAIR) 10 MG tablet; Take 1 tablet (10 mg total) by mouth at bedtime.     Orrin Yurkovich A Lakelynn Severtson

## 2016-10-18 LAB — COMPREHENSIVE METABOLIC PANEL
A/G RATIO: 1.6 (ref 1.2–2.2)
ALT: 19 IU/L (ref 0–44)
AST: 19 IU/L (ref 0–40)
Albumin: 4.3 g/dL (ref 3.5–5.5)
Alkaline Phosphatase: 99 IU/L (ref 39–117)
BILIRUBIN TOTAL: 0.4 mg/dL (ref 0.0–1.2)
BUN/Creatinine Ratio: 11 (ref 9–20)
BUN: 12 mg/dL (ref 6–24)
CALCIUM: 9.6 mg/dL (ref 8.7–10.2)
CHLORIDE: 100 mmol/L (ref 96–106)
CO2: 25 mmol/L (ref 18–29)
Creatinine, Ser: 1.05 mg/dL (ref 0.76–1.27)
GFR, EST AFRICAN AMERICAN: 92 mL/min/{1.73_m2} (ref >59)
GFR, EST NON AFRICAN AMERICAN: 80 mL/min/{1.73_m2} (ref >59)
GLOBULIN, TOTAL: 2.7 g/dL (ref 1.5–4.5)
Glucose: 120 mg/dL — ABNORMAL HIGH (ref 65–99)
POTASSIUM: 4.7 mmol/L (ref 3.5–5.2)
SODIUM: 140 mmol/L (ref 134–144)
TOTAL PROTEIN: 7 g/dL (ref 6.0–8.5)

## 2016-10-18 LAB — LIPID PANEL
CHOL/HDL RATIO: 3.6 ratio (ref 0.0–5.0)
Cholesterol, Total: 146 mg/dL (ref 100–199)
HDL: 41 mg/dL (ref >39)
LDL CALC: 84 mg/dL (ref 0–99)
Triglycerides: 106 mg/dL (ref 0–149)
VLDL CHOLESTEROL CAL: 21 mg/dL (ref 5–40)

## 2016-10-18 LAB — HEMOGLOBIN A1C
Est. average glucose Bld gHb Est-mCnc: 166 mg/dL
Hgb A1c MFr Bld: 7.4 % — ABNORMAL HIGH (ref 4.8–5.6)

## 2016-10-21 ENCOUNTER — Telehealth: Payer: Self-pay | Admitting: Emergency Medicine

## 2016-10-21 NOTE — Telephone Encounter (Signed)
-----   Message from Doristine BosworthZoe A Stallings, MD sent at 10/19/2016  3:08 PM EDT ----- Please let the patient know that he has diabetes and I would like him to return to clinic to discuss how to treat this condition.  Please set him up with a follow up visit so that I can do the new diabetes onset visit.

## 2016-12-04 ENCOUNTER — Encounter (HOSPITAL_BASED_OUTPATIENT_CLINIC_OR_DEPARTMENT_OTHER): Payer: Self-pay | Admitting: *Deleted

## 2016-12-04 ENCOUNTER — Emergency Department (HOSPITAL_BASED_OUTPATIENT_CLINIC_OR_DEPARTMENT_OTHER)
Admission: EM | Admit: 2016-12-04 | Discharge: 2016-12-04 | Disposition: A | Discharge status: Home / Self Care | Payer: Federal, State, Local not specified - PPO | Attending: Emergency Medicine | Admitting: Emergency Medicine

## 2016-12-04 DIAGNOSIS — I10 Essential (primary) hypertension: Secondary | ICD-10-CM | POA: Insufficient documentation

## 2016-12-04 DIAGNOSIS — Z79899 Other long term (current) drug therapy: Secondary | ICD-10-CM | POA: Diagnosis not present

## 2016-12-04 DIAGNOSIS — J45909 Unspecified asthma, uncomplicated: Secondary | ICD-10-CM | POA: Insufficient documentation

## 2016-12-04 DIAGNOSIS — E119 Type 2 diabetes mellitus without complications: Secondary | ICD-10-CM | POA: Insufficient documentation

## 2016-12-04 DIAGNOSIS — K5641 Fecal impaction: Secondary | ICD-10-CM | POA: Insufficient documentation

## 2016-12-04 DIAGNOSIS — K5900 Constipation, unspecified: Secondary | ICD-10-CM | POA: Diagnosis present

## 2016-12-04 HISTORY — DX: Type 2 diabetes mellitus without complications: E11.9

## 2016-12-04 MED ORDER — LIDOCAINE HCL 2 % EX GEL
CUTANEOUS | Status: AC
  Filled 2016-12-04: qty 20

## 2016-12-04 MED ORDER — LIDOCAINE HCL 2 % EX GEL
1.0000 "application " | Freq: Once | CUTANEOUS | Status: AC
  Administered 2016-12-04: 1 via TOPICAL

## 2016-12-04 NOTE — ED Triage Notes (Signed)
Pt with constipation x 5 days. Pt presents with mild abd discomfort and rectal pain. Denies blood in stools or vomiting

## 2016-12-04 NOTE — ED Provider Notes (Signed)
[facility] Provider Note: Matthew Sawyer [name], MD, FACEP  CSN: [id number] MRN: [id number] ARRIVAL: 12/04/16 at 0549 ROOM: [facility]05/[facility]   CHIEF COMPLAINT  Constipation   HISTORY OF PRESENT ILLNESS  Matthew Sawyer is a 56 y.o. male who has not had a bowel movement in 5 days. Unlike previous episodes of constipation this was not precipitated by narcotic use. He states his abdomen feels distended and his rectal area is very painful and raw. He has taken Dulcolax as well as 3 bottles of magnesium citrate without relief of his constipation. He has been passing liquid stool around what feels like solid stool in his rectum. Pain in his rectum is moderate to severe and worse with walking or wiping. He had an episode of urinary incontinence earlier which he attributes to pressure from his rectum. He has not been vomiting.   Past Medical History:  Diagnosis Date  . Asthma   . Back pain   . Capsulitis of foot   . Constipation   . Diabetes mellitus without complication (HCC)   . History of hiatal hernia   . Hypertension   . Pneumonia   . Prolapsed intervertebral disk     Past Surgical History:  Procedure Laterality Date  . CARPAL TUNNEL RELEASE Bilateral   . COLONOSCOPY    . HERNIA REPAIR     navel  . TOTAL KNEE ARTHROPLASTY Right 10/02/2015   Procedure: RIGHT TOTAL KNEE ARTHROPLASTY;  Surgeon: Marcene CorningPeter Dalldorf, MD;  Location: [facility] OR;  Service: Orthopedics;  Laterality: Right;    Family History  Problem Relation Age of Onset  . Heart disease Mother   . Heart disease Father     Social History  Substance Use Topics  . Smoking status: Never Smoker  . Smokeless tobacco: Never Used  . Alcohol use 0.0 oz/week     Comment: occasional    Prior to Admission medications   Medication Sig Start Date End Date Taking? Authorizing Provider  albuterol (PROVENTIL HFA;VENTOLIN HFA) 108 (90 BASE) MCG/ACT inhaler Inhale 2 puffs into the lungs every 6 (six) hours as needed for wheezing or  shortness of breath.     Historical Provider, MD  amLODipine-benazepril (LOTREL) 5-10 MG capsule Take 1 capsule by mouth daily. 10/17/16   Doristine BosworthZoe A Stallings, MD  montelukast (SINGULAIR) 10 MG tablet Take 1 tablet (10 mg total) by mouth at bedtime. 10/17/16   Doristine BosworthZoe A Stallings, MD    Allergies Patient has no known allergies.   REVIEW OF SYSTEMS  Negative except as noted here or in the History of Present Illness.   PHYSICAL EXAMINATION  Initial Vital Signs Blood pressure 136/82, pulse (!) 136, temperature 98.1 F (36.7 C), temperature source Oral, resp. rate (!) 32, height 5\' 3"  (1.6 m), weight 250 lb (113.4 kg), SpO2 98 %.  Examination General: Well-developed, well-nourished male in no acute distress; appearance consistent with age of record HENT: normocephalic; atraumatic Eyes: Normal appearance Neck: supple Heart: regular rate and rhythm Lungs: clear to auscultation bilaterally Abdomen: soft; distended; mildly tender diffusely; bowel sounds present Rectal: Perirectal irritation and tenderness; fecal impaction in rectal vault Extremities: No deformity; full range of motion Neurologic: Awake, alert and oriented; motor function intact in all extremities and symmetric; no facial droop Skin: Warm and dry Psychiatric: Mildly agitated   RESULTS  Summary of this visit's results, reviewed by myself:   EKG Interpretation  Date/Time:    Ventricular Rate:    PR Interval:    QRS Duration:   QT Interval:  QTC Calculation:   R Axis:     Text Interpretation:        Laboratory Studies: No results found for this or any previous visit (from the past 24 hour(s)). Imaging Studies: No results found.  ED COURSE  Nursing notes and initial vitals signs, including pulse oximetry, reviewed.  Vitals:   12/04/16 0558  BP: 136/82  Pulse: (!) 136  Resp: (!) 32  Temp: 98.1 F (36.7 C)  TempSrc: Oral  SpO2: 98%  Weight: 250 lb (113.4 kg)  Height: 5\' 3"  (1.6 m)    PROCEDURES    DISIMPACTION The patient was administered a soapsuds enema which did not result in much passage of stool. I then lubricated his perianal area with 2% lidocaine gel and manually disimpacted multiple pieces of hard stool from the rectal vault. He was then able to pass a fist sized quantity of semisoft stool along with a fair amount of liquid stool. Given that he has taken 3 bottles of magnesium citrate in the last 24 hours I do not feel further manual disimpaction is indicated as he is freely passing liquid stool at this time.  ED DIAGNOSES     ICD-9-CM ICD-10-CM   1. Fecal impaction in rectum [facility]) 560.32 K56.41        Paula Libra, MD 12/04/16 0730

## 2017-04-25 ENCOUNTER — Other Ambulatory Visit: Payer: Self-pay | Admitting: Family Medicine

## 2017-04-25 DIAGNOSIS — I10 Essential (primary) hypertension: Secondary | ICD-10-CM

## 2017-04-25 NOTE — Telephone Encounter (Signed)
Refill request for amlodipine-benazepril 5-10 capsule  #30 with no refills.  Note to pharmacy no more refills without scheduled and kept appt.

## 2017-05-22 ENCOUNTER — Other Ambulatory Visit: Payer: Self-pay | Admitting: Family Medicine

## 2017-05-22 DIAGNOSIS — I10 Essential (primary) hypertension: Secondary | ICD-10-CM

## 2017-06-01 DIAGNOSIS — Z23 Encounter for immunization: Secondary | ICD-10-CM | POA: Diagnosis not present

## 2017-06-09 DIAGNOSIS — Z0189 Encounter for other specified special examinations: Secondary | ICD-10-CM | POA: Diagnosis not present

## 2017-06-09 DIAGNOSIS — Z13220 Encounter for screening for lipoid disorders: Secondary | ICD-10-CM | POA: Diagnosis not present

## 2017-06-09 DIAGNOSIS — Z131 Encounter for screening for diabetes mellitus: Secondary | ICD-10-CM | POA: Diagnosis not present

## 2017-08-20 DIAGNOSIS — E119 Type 2 diabetes mellitus without complications: Secondary | ICD-10-CM | POA: Diagnosis not present

## 2017-12-07 DIAGNOSIS — Z713 Dietary counseling and surveillance: Secondary | ICD-10-CM | POA: Diagnosis not present

## 2017-12-07 DIAGNOSIS — Z125 Encounter for screening for malignant neoplasm of prostate: Secondary | ICD-10-CM | POA: Diagnosis not present

## 2017-12-07 DIAGNOSIS — Z131 Encounter for screening for diabetes mellitus: Secondary | ICD-10-CM | POA: Diagnosis not present

## 2017-12-07 DIAGNOSIS — E669 Obesity, unspecified: Secondary | ICD-10-CM | POA: Diagnosis not present

## 2017-12-07 DIAGNOSIS — Z0001 Encounter for general adult medical examination with abnormal findings: Secondary | ICD-10-CM | POA: Diagnosis not present

## 2017-12-07 DIAGNOSIS — Z6841 Body Mass Index (BMI) 40.0 and over, adult: Secondary | ICD-10-CM | POA: Diagnosis not present

## 2018-01-13 DIAGNOSIS — H5213 Myopia, bilateral: Secondary | ICD-10-CM | POA: Diagnosis not present

## 2018-01-13 DIAGNOSIS — I10 Essential (primary) hypertension: Secondary | ICD-10-CM | POA: Diagnosis not present

## 2018-01-13 DIAGNOSIS — E119 Type 2 diabetes mellitus without complications: Secondary | ICD-10-CM | POA: Diagnosis not present

## 2018-01-13 DIAGNOSIS — H2513 Age-related nuclear cataract, bilateral: Secondary | ICD-10-CM | POA: Diagnosis not present

## 2018-01-27 DIAGNOSIS — H5213 Myopia, bilateral: Secondary | ICD-10-CM | POA: Diagnosis not present

## 2018-05-05 DIAGNOSIS — Z23 Encounter for immunization: Secondary | ICD-10-CM | POA: Diagnosis not present

## 2018-06-17 DIAGNOSIS — M25561 Pain in right knee: Secondary | ICD-10-CM | POA: Diagnosis not present

## 2018-06-17 DIAGNOSIS — E119 Type 2 diabetes mellitus without complications: Secondary | ICD-10-CM | POA: Diagnosis not present

## 2018-06-17 DIAGNOSIS — E669 Obesity, unspecified: Secondary | ICD-10-CM | POA: Diagnosis not present

## 2018-06-17 DIAGNOSIS — M545 Low back pain: Secondary | ICD-10-CM | POA: Diagnosis not present

## 2018-06-17 DIAGNOSIS — M79672 Pain in left foot: Secondary | ICD-10-CM | POA: Diagnosis not present

## 2018-12-13 DIAGNOSIS — J329 Chronic sinusitis, unspecified: Secondary | ICD-10-CM | POA: Diagnosis not present

## 2018-12-13 DIAGNOSIS — E119 Type 2 diabetes mellitus without complications: Secondary | ICD-10-CM | POA: Diagnosis not present

## 2018-12-13 DIAGNOSIS — J4530 Mild persistent asthma, uncomplicated: Secondary | ICD-10-CM | POA: Diagnosis not present

## 2018-12-13 DIAGNOSIS — I10 Essential (primary) hypertension: Secondary | ICD-10-CM | POA: Diagnosis not present

## 2019-02-25 ENCOUNTER — Encounter (HOSPITAL_BASED_OUTPATIENT_CLINIC_OR_DEPARTMENT_OTHER): Payer: Self-pay | Admitting: *Deleted

## 2019-02-25 ENCOUNTER — Emergency Department (HOSPITAL_BASED_OUTPATIENT_CLINIC_OR_DEPARTMENT_OTHER): Payer: Federal, State, Local not specified - PPO

## 2019-02-25 ENCOUNTER — Emergency Department (HOSPITAL_BASED_OUTPATIENT_CLINIC_OR_DEPARTMENT_OTHER)
Admission: EM | Admit: 2019-02-25 | Discharge: 2019-02-25 | Disposition: A | Discharge status: Home / Self Care | Payer: Federal, State, Local not specified - PPO | Attending: Emergency Medicine | Admitting: Emergency Medicine

## 2019-02-25 ENCOUNTER — Other Ambulatory Visit: Payer: Self-pay

## 2019-02-25 DIAGNOSIS — S99921A Unspecified injury of right foot, initial encounter: Secondary | ICD-10-CM | POA: Diagnosis not present

## 2019-02-25 DIAGNOSIS — S91114A Laceration without foreign body of right lesser toe(s) without damage to nail, initial encounter: Secondary | ICD-10-CM | POA: Diagnosis not present

## 2019-02-25 DIAGNOSIS — Y92013 Bedroom of single-family (private) house as the place of occurrence of the external cause: Secondary | ICD-10-CM | POA: Insufficient documentation

## 2019-02-25 DIAGNOSIS — Z23 Encounter for immunization: Secondary | ICD-10-CM | POA: Insufficient documentation

## 2019-02-25 DIAGNOSIS — Z79899 Other long term (current) drug therapy: Secondary | ICD-10-CM | POA: Insufficient documentation

## 2019-02-25 DIAGNOSIS — J45909 Unspecified asthma, uncomplicated: Secondary | ICD-10-CM | POA: Diagnosis not present

## 2019-02-25 DIAGNOSIS — I10 Essential (primary) hypertension: Secondary | ICD-10-CM | POA: Insufficient documentation

## 2019-02-25 DIAGNOSIS — W010XXA Fall on same level from slipping, tripping and stumbling without subsequent striking against object, initial encounter: Secondary | ICD-10-CM | POA: Diagnosis not present

## 2019-02-25 DIAGNOSIS — M7989 Other specified soft tissue disorders: Secondary | ICD-10-CM | POA: Diagnosis not present

## 2019-02-25 DIAGNOSIS — Y9301 Activity, walking, marching and hiking: Secondary | ICD-10-CM | POA: Diagnosis not present

## 2019-02-25 DIAGNOSIS — M2011 Hallux valgus (acquired), right foot: Secondary | ICD-10-CM | POA: Diagnosis not present

## 2019-02-25 DIAGNOSIS — Z96651 Presence of right artificial knee joint: Secondary | ICD-10-CM | POA: Diagnosis not present

## 2019-02-25 DIAGNOSIS — Y999 Unspecified external cause status: Secondary | ICD-10-CM | POA: Insufficient documentation

## 2019-02-25 DIAGNOSIS — E119 Type 2 diabetes mellitus without complications: Secondary | ICD-10-CM | POA: Diagnosis not present

## 2019-02-25 DIAGNOSIS — Z7984 Long term (current) use of oral hypoglycemic drugs: Secondary | ICD-10-CM | POA: Insufficient documentation

## 2019-02-25 DIAGNOSIS — M7731 Calcaneal spur, right foot: Secondary | ICD-10-CM | POA: Diagnosis not present

## 2019-02-25 MED ORDER — CEPHALEXIN 250 MG PO CAPS: 250.0000 mg | ORAL_CAPSULE | Freq: Four times a day (QID) | ORAL | 0 refills | Status: DC

## 2019-02-25 MED ORDER — LIDOCAINE HCL 2 % IJ SOLN
10.0000 mL | Freq: Once | INTRAMUSCULAR | Status: AC
  Administered 2019-02-25: 200 mg
  Filled 2019-02-25: qty 20

## 2019-02-25 MED ORDER — TETANUS-DIPHTH-ACELL PERTUSSIS 5-2.5-18.5 LF-MCG/0.5 IM SUSP
0.5000 mL | Freq: Once | INTRAMUSCULAR | Status: AC
  Administered 2019-02-25: 0.5 mL via INTRAMUSCULAR
  Filled 2019-02-25: qty 0.5

## 2019-02-25 NOTE — ED Notes (Signed)
ED Provider at bedside. 

## 2019-02-25 NOTE — ED Provider Notes (Signed)
[facility] EMERGENCY DEPARTMENT Provider Note   CSN: [id number] Arrival date & time: 02/25/19  [id number]    History   Chief Complaint Chief Complaint  Patient presents with  . Fall    HPI Matthew Sawyer is a 58 y.o. male.     The history is provided by the patient and medical records. No language interpreter was used.  Fall   Matthew Sawyer is a 58 y.o. male who presents to the Emergency Department complaining of fall, toe injury. He has a history of frequent falls due to his right knee giving out a lot. He was walking at home barefooted when his knee gave out and he fell to the ground. He feels like his foot twisted and his fifth toe bent back and almost fell off. He only has minimal pain to his foot and toe. He denies any head injury or additional symptoms. Denies any recent illnesses. Last tetanus shot is unknown. Past Medical History:  Diagnosis Date  . Asthma   . Back pain   . Capsulitis of foot   . Constipation   . Diabetes mellitus without complication (HCC)   . History of hiatal hernia   . Hypertension   . Pneumonia   . Prolapsed intervertebral disk     Patient Active Problem List   Diagnosis Date Noted  . Primary osteoarthritis of right knee 10/02/2015  . Hypertension 10/17/2013  . Morbid obesity (HCC) 10/17/2013    Past Surgical History:  Procedure Laterality Date  . CARPAL TUNNEL RELEASE Bilateral   . COLONOSCOPY    . HERNIA REPAIR     navel  . TOTAL KNEE ARTHROPLASTY Right 10/02/2015   Procedure: RIGHT TOTAL KNEE ARTHROPLASTY;  Surgeon: Marcene CorningPeter Dalldorf, MD;  Location: [facility] OR;  Service: Orthopedics;  Laterality: Right;        Home Medications    Prior to Admission medications   Medication Sig Start Date End Date Taking? Authorizing Provider  albuterol (PROVENTIL HFA;VENTOLIN HFA) 108 (90 BASE) MCG/ACT inhaler Inhale 2 puffs into the lungs every 6 (six) hours as needed for wheezing or shortness of breath.    Yes [provider]   amLODipine-benazepril (LOTREL) 5-10 MG capsule TAKE 1 CAPSULE BY MOUTH EVERY DAY 04/25/17  Yes Stallings, Zoe A, MD  metFORMIN (GLUCOPHAGE) 500 MG tablet Take by mouth daily with breakfast.   Yes [provider]  montelukast (SINGULAIR) 10 MG tablet Take 1 tablet (10 mg total) by mouth at bedtime. 10/17/16  Yes Stallings, Zoe A, MD  cephALEXin (KEFLEX) 250 MG capsule Take 1 capsule (250 mg total) by mouth 4 (four) times daily. 02/25/19   Tilden Fossaees, Jefferey Lippmann, MD    Family History Family History  Problem Relation Age of Onset  . Heart disease Mother   . Heart disease Father     Social History Social History   Tobacco Use  . Smoking status: Never Smoker  . Smokeless tobacco: Never Used  Substance Use Topics  . Alcohol use: Yes    Alcohol/week: 0.0 standard drinks    Comment: occasional  . Drug use: No     Allergies   Patient has no known allergies.   Review of Systems Review of Systems  All other systems reviewed and are negative.    Physical Exam Updated Vital Signs BP 134/82 (BP Location: Right Arm)   Pulse 74   Temp 97.9 F (36.6 C) (Oral)   Resp 16   Ht 5\' 3"  (1.6 m)   Wt 122.5 kg  SpO2 98%   BMI 47.83 kg/m   Physical Exam Vitals signs and nursing note reviewed.  Constitutional:      Appearance: Normal appearance.  HENT:     Head: Normocephalic and atraumatic.  Neck:     Musculoskeletal: Neck supple.  Cardiovascular:     Rate and Rhythm: Normal rate and regular rhythm.  Pulmonary:     Effort: Pulmonary effort is normal. No respiratory distress.  Musculoskeletal:     Comments: 2+ DP pulses bilaterally. There is mild edema to bilateral lower extremities. There is no pinpoint tenderness to the ankle, foot. There is a deep laceration to the plantar aspect of the fifth toe. Range of motion intact at the ankle, wiggles toes.  Skin:    General: Skin is warm and dry.     Capillary Refill: Capillary refill takes less than 2 seconds.  Neurological:      Mental Status: He is alert and oriented to person, place, and time.  Psychiatric:        Mood and Affect: Mood normal.        Behavior: Behavior normal.        ED Treatments / Results  Labs (all labs ordered are listed, but only abnormal results are displayed) Labs Reviewed - No data to display  EKG None  Radiology Dg Foot Complete Right  Result Date: 02/25/2019 CLINICAL DATA:  Fall. Right fifth toe injury with pain. History of diabetes. EXAM: RIGHT FOOT COMPLETE - 3+ VIEW COMPARISON:  None. FINDINGS: No acute fracture or dislocation is identified. Mild-to-moderate marginal spurring is noted at the first MTP joint with hypertrophy of the first metatarsal head and hallux valgus deformity. There is moderate soft tissue swelling in the forefoot. Extensive atherosclerotic vascular calcification is noted. IMPRESSION: Soft tissue swelling without acute osseous abnormality identified. Electronically Signed   By: Logan Bores M.D.   On: 02/25/2019 10:48    Procedures .Marland KitchenLaceration Repair  Date/Time: 02/25/2019 12:49 PM Performed by: Quintella Reichert, MD Authorized by: Quintella Reichert, MD   Consent:    Consent obtained:  Verbal   Consent given by:  Patient   Risks discussed:  Pain, infection, poor cosmetic result, need for additional repair and poor wound healing Anesthesia (see MAR for exact dosages):    Anesthesia method:  Local infiltration   Local anesthetic:  Lidocaine 2% w/o epi Laceration details:    Location:  Toe   Toe location:  R little toe   Length (cm):  2.5 Repair type:    Repair type:  Simple Exploration:    Hemostasis achieved with:  Direct pressure   Wound exploration: wound explored through full range of motion     Contaminated: no   Treatment:    Area cleansed with:  Betadine   Amount of cleaning:  Standard   Irrigation solution:  Sterile saline Skin repair:    Repair method:  Sutures   Suture size:  4-0   Suture material:  Prolene   Suture technique:   Simple interrupted   Number of sutures:  3 Post-procedure details:    Dressing:  Antibiotic ointment and non-adherent dressing   Patient tolerance of procedure:  Tolerated well, no immediate complications   (including critical care time)  Medications Ordered in ED Medications  Tdap (BOOSTRIX) injection 0.5 mL (0.5 mLs Intramuscular Given 02/25/19 1043)  lidocaine (XYLOCAINE) 2 % (with pres) injection 200 mg (200 mg Infiltration Given by Other 02/25/19 1042)     Initial Impression / Assessment and Plan /  ED Course  I have reviewed the triage vital signs and the nursing notes.  Pertinent labs & imaging results that were available during my care of the patient were reviewed by me and considered in my medical decision making (see chart for details).       Patient here for evaluation of fifth digit injury. He has no bony tenderness throughout the foot and ankle. He has a deep laceration to the plantar aspect of the foot. There is no evidence of foreign body. Wound was irrigated and closed per procedure note. He was started on prophylactic antibiotics due to his diabetes. Discussed with patient wound care, outpatient follow-up and return precautions. Final Clinical Impressions(s) / ED Diagnoses   Final diagnoses:  Laceration of lesser toe of right foot without foreign body present or damage to nail, initial encounter    ED Discharge Orders         Ordered    cephALEXin (KEFLEX) 250 MG capsule  4 times daily     02/25/19 1248           Tilden Fossaees, Tayllor Breitenstein, MD 02/25/19 1556

## 2019-02-25 NOTE — ED Notes (Signed)
Dressing to right little toe applied with Bacitracin, Xeroform and covered with dry dressing.

## 2019-02-25 NOTE — ED Triage Notes (Signed)
Fell this morning.  Right knee gave way and patient feel in his bedroom and his right pinky toe bent all the way back.

## 2019-03-09 ENCOUNTER — Other Ambulatory Visit: Payer: Self-pay

## 2019-03-09 ENCOUNTER — Emergency Department (HOSPITAL_BASED_OUTPATIENT_CLINIC_OR_DEPARTMENT_OTHER)
Admission: EM | Admit: 2019-03-09 | Discharge: 2019-03-09 | Disposition: A | Discharge status: Home / Self Care | Payer: Federal, State, Local not specified - PPO | Attending: Emergency Medicine | Admitting: Emergency Medicine

## 2019-03-09 ENCOUNTER — Encounter (HOSPITAL_BASED_OUTPATIENT_CLINIC_OR_DEPARTMENT_OTHER): Payer: Self-pay

## 2019-03-09 DIAGNOSIS — Z7984 Long term (current) use of oral hypoglycemic drugs: Secondary | ICD-10-CM | POA: Insufficient documentation

## 2019-03-09 DIAGNOSIS — E119 Type 2 diabetes mellitus without complications: Secondary | ICD-10-CM | POA: Diagnosis not present

## 2019-03-09 DIAGNOSIS — Z79899 Other long term (current) drug therapy: Secondary | ICD-10-CM | POA: Insufficient documentation

## 2019-03-09 DIAGNOSIS — I10 Essential (primary) hypertension: Secondary | ICD-10-CM | POA: Diagnosis not present

## 2019-03-09 DIAGNOSIS — Z4802 Encounter for removal of sutures: Secondary | ICD-10-CM | POA: Diagnosis not present

## 2019-03-09 DIAGNOSIS — J45909 Unspecified asthma, uncomplicated: Secondary | ICD-10-CM | POA: Diagnosis not present

## 2019-03-09 DIAGNOSIS — S91114D Laceration without foreign body of right lesser toe(s) without damage to nail, subsequent encounter: Secondary | ICD-10-CM | POA: Diagnosis not present

## 2019-03-09 NOTE — ED Triage Notes (Signed)
Pt for suture removal from toe-placed 7/24-NAD-steady gait

## 2019-03-09 NOTE — ED Provider Notes (Signed)
[facility] EMERGENCY DEPARTMENT Provider Note   CSN: [id number] Arrival date & time: 03/09/19  1538     History   Chief Complaint Chief Complaint  Patient presents with  . Suture / Staple Removal    HPI Matthew Sawyer is a 58 y.o. male.     The history is provided by the patient.  Suture / Staple Removal This is a new problem. Episode onset: Sutures were placed 12 days ago. The problem occurs constantly. The problem has been resolved. Associated symptoms comments: No drainage or pain around the wound. Nothing aggravates the symptoms. Nothing relieves the symptoms.    Past Medical History:  Diagnosis Date  . Asthma   . Back pain   . Capsulitis of foot   . Constipation   . Diabetes mellitus without complication ([place])   . History of hiatal hernia   . Hypertension   . Pneumonia   . Prolapsed intervertebral disk     Patient Active Problem List   Diagnosis Date Noted  . Primary osteoarthritis of right knee 10/02/2015  . Hypertension 10/17/2013  . Morbid obesity ([place]) 10/17/2013    Past Surgical History:  Procedure Laterality Date  . CARPAL TUNNEL RELEASE Bilateral   . COLONOSCOPY    . HERNIA REPAIR     navel  . TOTAL KNEE ARTHROPLASTY Right 10/02/2015   Procedure: RIGHT TOTAL KNEE ARTHROPLASTY;  Surgeon: Melrose Nakayama, MD;  Location: [facility];  Service: Orthopedics;  Laterality: Right;        Home Medications    Prior to Admission medications   Medication Sig Start Date End Date Taking? Authorizing Provider  albuterol (PROVENTIL HFA;VENTOLIN HFA) 108 (90 BASE) MCG/ACT inhaler Inhale 2 puffs into the lungs every 6 (six) hours as needed for wheezing or shortness of breath.     [provider]  amLODipine-benazepril (LOTREL) 5-10 MG capsule TAKE 1 CAPSULE BY MOUTH EVERY DAY 04/25/17   Delia Chimes A, MD  cephALEXin (KEFLEX) 250 MG capsule Take 1 capsule (250 mg total) by mouth 4 (four) times daily. 02/25/19   Quintella Reichert, MD  metFORMIN  (GLUCOPHAGE) 500 MG tablet Take by mouth daily with breakfast.    [provider]  montelukast (SINGULAIR) 10 MG tablet Take 1 tablet (10 mg total) by mouth at bedtime. 10/17/16   Forrest Moron, MD    Family History Family History  Problem Relation Age of Onset  . Heart disease Mother   . Heart disease Father     Social History Social History   Tobacco Use  . Smoking status: Never Smoker  . Smokeless tobacco: Never Used  Substance Use Topics  . Alcohol use: Yes    Alcohol/week: 0.0 standard drinks    Comment: occasional  . Drug use: No     Allergies   Patient has no known allergies.   Review of Systems Review of Systems  All other systems reviewed and are negative.    Physical Exam Updated Vital Signs BP 127/85 (BP Location: Left Arm)   Pulse (!) 110   Temp 98.4 F (36.9 C) (Oral)   Resp 16   SpO2 98%   Physical Exam Vitals signs and nursing note reviewed.  Constitutional:      General: He is not in acute distress.    Appearance: Normal appearance. He is obese.  HENT:     Head: Normocephalic.  Cardiovascular:     Rate and Rhythm: Tachycardia present.  Pulmonary:     Effort: Pulmonary effort is  normal.  Musculoskeletal:     Comments: Healing laceration at the plantar surface of the base of the 5th toe  Skin:    General: Skin is warm.  Neurological:     Mental Status: He is alert. Mental status is at baseline.  Psychiatric:        Mood and Affect: Mood normal.      ED Treatments / Results  Labs (all labs ordered are listed, but only abnormal results are displayed) Labs Reviewed - No data to display  EKG None  Radiology No results found.  Procedures .Suture Removal  Date/Time: 03/09/2019 4:11 PM Performed by: Gwyneth SproutPlunkett, Shayli Altemose, MD Authorized by: Gwyneth SproutPlunkett, Gregory Barrick, MD   Consent:    Consent obtained:  Verbal   Consent given by:  Patient   Risks discussed:  Bleeding, pain and wound separation   Alternatives discussed:  No  treatment Location:    Location:  Lower extremity   Lower extremity location:  Toe   Toe location:  R little toe Procedure details:    Wound appearance:  No signs of infection, good wound healing and clean   Number of sutures removed:  3 Post-procedure details:    Post-removal:  No dressing applied   Patient tolerance of procedure:  Tolerated well, no immediate complications   (including critical care time)  Medications Ordered in ED Medications - No data to display   Initial Impression / Assessment and Plan / ED Course  I have reviewed the triage vital signs and the nursing notes.  Pertinent labs & imaging results that were available during my care of the patient were reviewed by me and considered in my medical decision making (see chart for details).       Patient here for suture removal.  Wound is healing well with no evidence of infection  Final Clinical Impressions(s) / ED Diagnoses   Final diagnoses:  Visit for suture removal    ED Discharge Orders    None       Gwyneth SproutPlunkett, Roberts Bon, MD 03/09/19 1612

## 2019-05-24 DIAGNOSIS — Z23 Encounter for immunization: Secondary | ICD-10-CM | POA: Diagnosis not present

## 2019-11-23 ENCOUNTER — Other Ambulatory Visit: Payer: Self-pay

## 2019-11-23 ENCOUNTER — Encounter (HOSPITAL_BASED_OUTPATIENT_CLINIC_OR_DEPARTMENT_OTHER): Payer: Self-pay

## 2019-11-23 ENCOUNTER — Emergency Department (HOSPITAL_BASED_OUTPATIENT_CLINIC_OR_DEPARTMENT_OTHER)
Admission: EM | Admit: 2019-11-23 | Discharge: 2019-11-23 | Disposition: A | Discharge status: Home / Self Care | Payer: Federal, State, Local not specified - PPO | Attending: Emergency Medicine | Admitting: Emergency Medicine

## 2019-11-23 DIAGNOSIS — N3001 Acute cystitis with hematuria: Secondary | ICD-10-CM | POA: Insufficient documentation

## 2019-11-23 DIAGNOSIS — Z7984 Long term (current) use of oral hypoglycemic drugs: Secondary | ICD-10-CM | POA: Diagnosis not present

## 2019-11-23 DIAGNOSIS — Z79899 Other long term (current) drug therapy: Secondary | ICD-10-CM | POA: Insufficient documentation

## 2019-11-23 DIAGNOSIS — I10 Essential (primary) hypertension: Secondary | ICD-10-CM | POA: Insufficient documentation

## 2019-11-23 DIAGNOSIS — Z760 Encounter for issue of repeat prescription: Secondary | ICD-10-CM | POA: Insufficient documentation

## 2019-11-23 DIAGNOSIS — R32 Unspecified urinary incontinence: Secondary | ICD-10-CM | POA: Diagnosis present

## 2019-11-23 DIAGNOSIS — E119 Type 2 diabetes mellitus without complications: Secondary | ICD-10-CM | POA: Diagnosis not present

## 2019-11-23 DIAGNOSIS — J45909 Unspecified asthma, uncomplicated: Secondary | ICD-10-CM | POA: Diagnosis not present

## 2019-11-23 LAB — URINALYSIS, ROUTINE W REFLEX MICROSCOPIC
Bilirubin Urine: NEGATIVE
Glucose, UA: NEGATIVE mg/dL
Ketones, ur: NEGATIVE mg/dL
Nitrite: NEGATIVE
Protein, ur: 30 mg/dL — AB
Specific Gravity, Urine: 1.025 (ref 1.005–1.030)
pH: 6 (ref 5.0–8.0)

## 2019-11-23 LAB — URINALYSIS, MICROSCOPIC (REFLEX)

## 2019-11-23 MED ORDER — AMLODIPINE BESY-BENAZEPRIL HCL 5-10 MG PO CAPS: ORAL_CAPSULE | ORAL | 0 refills | Status: AC

## 2019-11-23 MED ORDER — LINACLOTIDE 72 MCG PO CAPS: 72.0000 ug | ORAL_CAPSULE | Freq: Every day | ORAL | 0 refills | Status: DC

## 2019-11-23 MED ORDER — CEPHALEXIN 250 MG PO CAPS
1000.0000 mg | ORAL_CAPSULE | Freq: Once | ORAL | Status: AC
  Administered 2019-11-23: 1000 mg via ORAL
  Filled 2019-11-23: qty 4

## 2019-11-23 MED ORDER — CEPHALEXIN 500 MG PO CAPS: 1000.0000 mg | ORAL_CAPSULE | Freq: Two times a day (BID) | ORAL | 0 refills | Status: DC

## 2019-11-23 NOTE — ED Triage Notes (Signed)
Pt c/o urinary incontinence since 4/9-no medical f/u-NAD-slow steady gait with own cane

## 2019-11-23 NOTE — ED Provider Notes (Signed)
[facility] EMERGENCY DEPARTMENT Provider Note   CSN: [id number] Arrival date & time: 11/23/19  1857     History Chief Complaint  Patient presents with  . Urinary Incontinence    Matthew Sawyer is a 59 y.o. male.  HPI Patient reports he has been having problems with urinary incontinence for a couple weeks.  He reports that he initially has some burning with urination but that has resolved.  He reports now he has intense urgency.  He reports as soon as he has to urinate if he is not on the toilet he will start at being incontinent.  He reports it is limiting his lifestyle.  He reports he is urinating multiple times per day.  Reports sometimes a large amount passes and sometimes is just a small amount.  He reports is not as bad at nighttime.  He denies any flank pain.  He denies scrotal pain.  Patient reports he is sexually active with his wife but has no concern for sexually transmitted disease.  Patient reports he also needs a refill for medications.  He reports he goes to the [place] but has not been able to begin to see his doctor for quite a while.  He reports he has between doctors and is looking for a doctor locally in [place].  Reports he needs a refill for his blood pressure medication.  He also reports he has chronic problems with constipation.  He reports he has tried multiple different types of medications over time.  He reports MiraLAX and Colace are no longer working for him.  He has tried Linzess and feels that that is really the only medication that has been helpful.  He would like a temporary refill of Linzess.    Past Medical History:  Diagnosis Date  . Asthma   . Back pain   . Capsulitis of foot   . Constipation   . Diabetes mellitus without complication ([facility])   . History of hiatal hernia   . Hypertension   . Pneumonia   . Prolapsed intervertebral disk     Patient Active Problem List   Diagnosis Date Noted  . Primary osteoarthritis of right knee  10/02/2015  . Hypertension 10/17/2013  . Morbid obesity ([facility]) 10/17/2013    Past Surgical History:  Procedure Laterality Date  . CARPAL TUNNEL RELEASE Bilateral   . COLONOSCOPY    . HERNIA REPAIR     navel  . TOTAL KNEE ARTHROPLASTY Right 10/02/2015   Procedure: RIGHT TOTAL KNEE ARTHROPLASTY;  Surgeon: Marcene Corning, MD;  Location: [facility];  Service: Orthopedics;  Laterality: Right;       Family History  Problem Relation Age of Onset  . Heart disease Mother   . Heart disease Father     Social History   Tobacco Use  . Smoking status: Never Smoker  . Smokeless tobacco: Never Used  Substance Use Topics  . Alcohol use: Yes    Alcohol/week: 0.0 standard drinks    Comment: occasional  . Drug use: No    Home Medications Prior to Admission medications   Medication Sig Start Date End Date Taking? Authorizing Provider  albuterol (PROVENTIL HFA;VENTOLIN HFA) 108 (90 BASE) MCG/ACT inhaler Inhale 2 puffs into the lungs every 6 (six) hours as needed for wheezing or shortness of breath.     [provider]  amLODipine-benazepril (LOTREL) 5-10 MG capsule TAKE 1 CAPSULE BY MOUTH EVERY DAY 11/23/19   Arby Barrette, MD  cephALEXin (KEFLEX) 250 MG capsule Take 1  capsule (250 mg total) by mouth 4 (four) times daily. 02/25/19   Quintella Reichert, MD  cephALEXin (KEFLEX) 500 MG capsule Take 2 capsules (1,000 mg total) by mouth 2 (two) times daily. 11/23/19   Charlesetta Shanks, MD  linaclotide Rolan Lipa) 72 MCG capsule Take 1 capsule (72 mcg total) by mouth daily before breakfast. 11/23/19   Charlesetta Shanks, MD  metFORMIN (GLUCOPHAGE) 500 MG tablet Take by mouth daily with breakfast.    [provider]  montelukast (SINGULAIR) 10 MG tablet Take 1 tablet (10 mg total) by mouth at bedtime. 10/17/16   Forrest Moron, MD    Allergies    Patient has no known allergies.  Review of Systems   Review of Systems 10 Systems reviewed and are negative for acute change except as noted in the  HPI.Physical Exam Updated Vital Signs BP (!) 140/94 (BP Location: Left Arm)   Pulse 96   Temp 98.5 F (36.9 C) (Oral)   Resp 18   Ht 5\' 3"  (1.6 m)   Wt 116.6 kg   SpO2 96%   BMI 45.53 kg/m   Physical Exam Constitutional:      Comments: Alert and nontoxic.  Sitting in a chair in the room.  No respiratory distress.  HENT:     Head: Normocephalic and atraumatic.  Eyes:     Extraocular Movements: Extraocular movements intact.  Cardiovascular:     Rate and Rhythm: Normal rate and regular rhythm.  Pulmonary:     Effort: Pulmonary effort is normal.     Breath sounds: Normal breath sounds.  Abdominal:     General: There is no distension.     Palpations: Abdomen is soft.     Tenderness: There is no abdominal tenderness. There is no guarding.  Musculoskeletal:        General: Normal range of motion.     Comments: Patient wears knee braces bilaterally.  He reports he has bad knees and is going to be seen by orthopedics.  No significant peripheral edema but he does have bilateral inversion of the ankles with some mild effusion at the ankles.  No erythema or warmth.  He reports that problems with his feet and ankles are chronic and longstanding.  No CVA tenderness.  Skin:    General: Skin is warm and dry.  Neurological:     General: No focal deficit present.     Mental Status: He is oriented to person, place, and time.     Motor: No weakness.     Coordination: Coordination normal.  Psychiatric:        Mood and Affect: Mood normal.     ED Results / Procedures / Treatments   Labs (all labs ordered are listed, but only abnormal results are displayed) Labs Reviewed  URINALYSIS, ROUTINE W REFLEX MICROSCOPIC - Abnormal; Notable for the following components:      Result Value   APPearance CLOUDY (*)    Hgb urine dipstick SMALL (*)    Protein, ur 30 (*)    Leukocytes,Ua SMALL (*)    All other components within normal limits  URINALYSIS, MICROSCOPIC (REFLEX) - Abnormal; Notable for  the following components:   Bacteria, UA FEW (*)    All other components within normal limits  URINE CULTURE    EKG None  Radiology No results found.  Procedures Procedures (including critical care time)  Medications Ordered in ED Medications  cephALEXin (KEFLEX) capsule 1,000 mg (1,000 mg Oral Given 11/23/19 2302)    ED  Course  I have reviewed the triage vital signs and the nursing notes.  Pertinent labs & imaging results that were available during my care of the patient were reviewed by me and considered in my medical decision making (see chart for details).    MDM Rules/Calculators/A&P                     Patient is alert and clinically well in appearance.  He reports he has recently developed problems with incontinence.  This is predominantly occurring in the daytime and may be associated with passing only small amounts of urine with urgency or fairly large volumes.  Bladder scan does not show any urinary retention.  Urinalysis is suggestive of UTI.  Will obtain culture and treat empirically at this time.  Patient has no CVA tenderness and no constitutional symptoms.  He is counseled on necessity to follow-up with the PCP and possible need for urology referral.  Patient also advised he needed some refills for medications.  At this time his blood pressure medication Linzess will be refilled by request.  He is clinically well with a soft nontender abdomen.  He denies any problems with chest pain, shortness of breath.  Return precautions reviewed. Final Clinical Impression(s) / ED Diagnoses Final diagnoses:  Urinary incontinence, unspecified type  Acute cystitis with hematuria  Medication refill    Rx / DC Orders ED Discharge Orders         Ordered    cephALEXin (KEFLEX) 500 MG capsule  2 times daily     11/23/19 2338    amLODipine-benazepril (LOTREL) 5-10 MG capsule    Note to Pharmacy: Needs ov and no more refills until appt scheduled and kept.   11/23/19 2338     linaclotide (LINZESS) 72 MCG capsule  Daily before breakfast     11/23/19 2338           Arby Barrette, MD 11/23/19 2346

## 2019-11-23 NOTE — Discharge Instructions (Addendum)
1.  At this time, your bladder does not appear to be retaining a large amount of urine.  Your urinalysis is suggestive of infection.  Keflex as prescribed.  You must follow-up with a family doctor to see if your symptoms resolved with treatment.  You may need referral to a urologist if you have persistent symptoms. 2.  You advised you need refills on several of your medications.  Is very important you get established with a family doctor as soon as possible to monitor your medical conditions and prescribe your medications.  Resource guide has been included in your discharge instructions to help you find a physician.  May also call the referral number in your discharge instructions for help. 3.  Return to the emergency department if you have any worsening or concerning symptoms.

## 2019-11-25 LAB — URINE CULTURE: Culture: >=100000 — AB

## 2019-11-26 ENCOUNTER — Telehealth: Payer: Self-pay | Admitting: Emergency Medicine

## 2019-11-26 NOTE — Telephone Encounter (Signed)
Post ED Visit - Positive Culture Follow-up: Successful Patient Follow-Up  Culture assessed and recommendations reviewed by:  []  , Pharm.D. []  Enzo Bi, Pharm.D., BCPS AQ-ID []  , Pharm.D., BCPS []  Celedonio Miyamoto, Pharm.D., BCPS []  [place], Garvin Fila.D., BCPS, AAHIVP []  , Pharm.D., BCPS, AAHIVP []  Georgina Pillion, PharmD, BCPS []  , PharmD, BCPS []  [place], PharmD, BCPS [x]  [address], PharmD  Positive urine culture  []  Patient discharged without antimicrobial prescription and treatment is now indicated [x]  Organism is resistant to prescribed ED discharge antimicrobial []  Patient with positive blood cultures  Changes discussed with ED provider: , [place] New antibiotic prescription: Cipro 500 mg PO every 12 hr x seven days Called to [facility]) [phone]  Contacted patient, date 11/26/2019, time 1600   Ravon Mortellaro C Sarah-Jane Nazario 11/26/2019, 4:27 PM

## 2020-12-10 IMAGING — DX RIGHT FOOT COMPLETE - 3+ VIEW
3 series · 3 of 3 positions shown · non-contrast
Comparison: None.

CLINICAL DATA: Fall. Right fifth toe injury with pain. History of
diabetes.

EXAM:
RIGHT FOOT COMPLETE - 3+ VIEW

[foot ap]
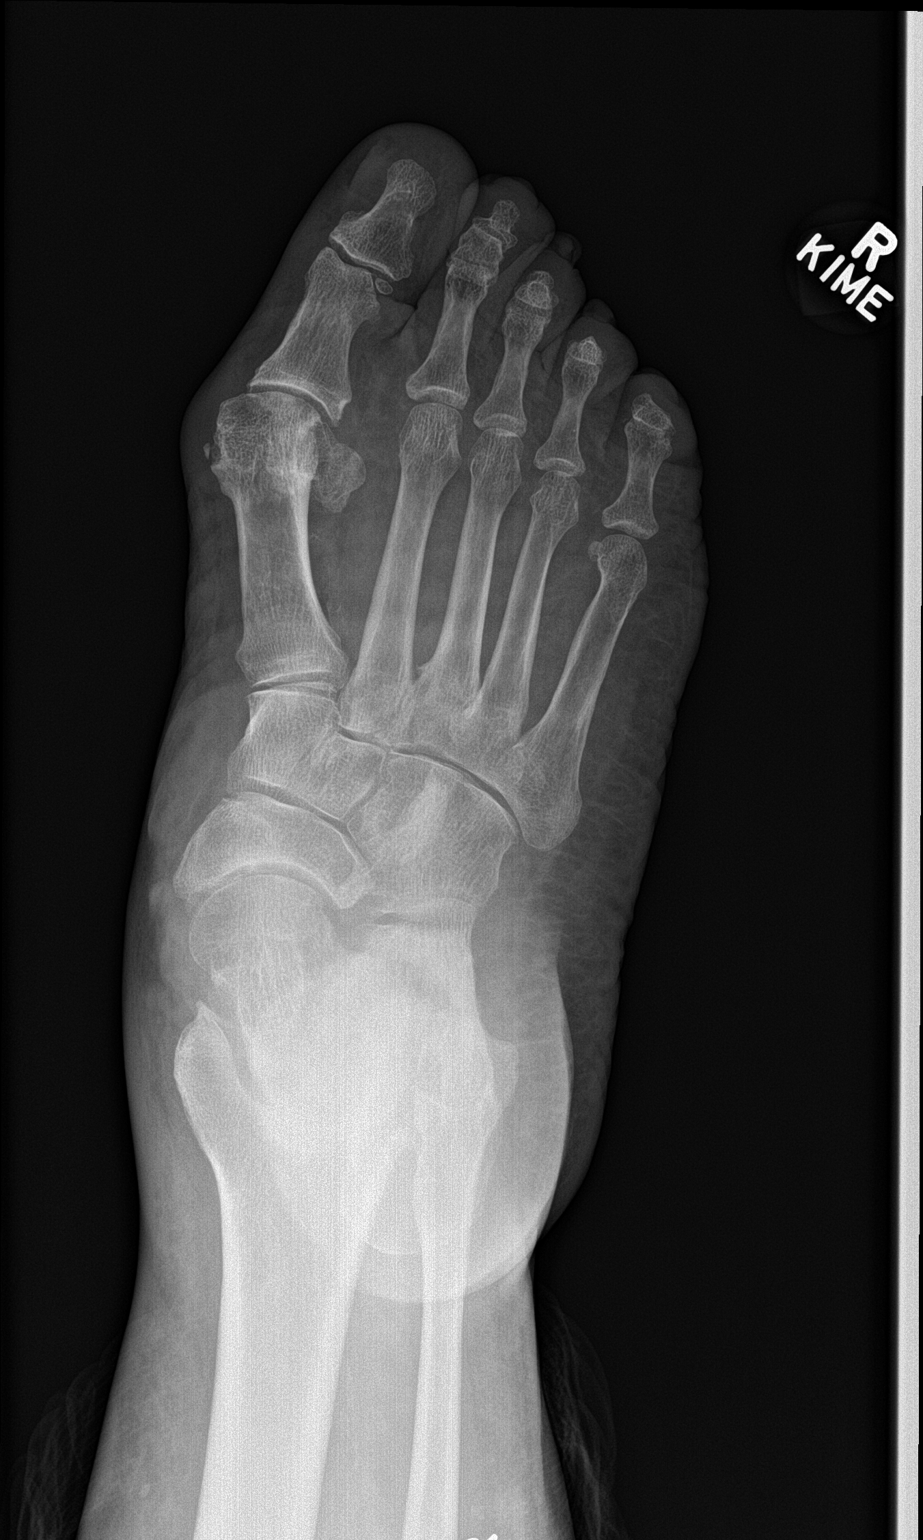

[foot obl]
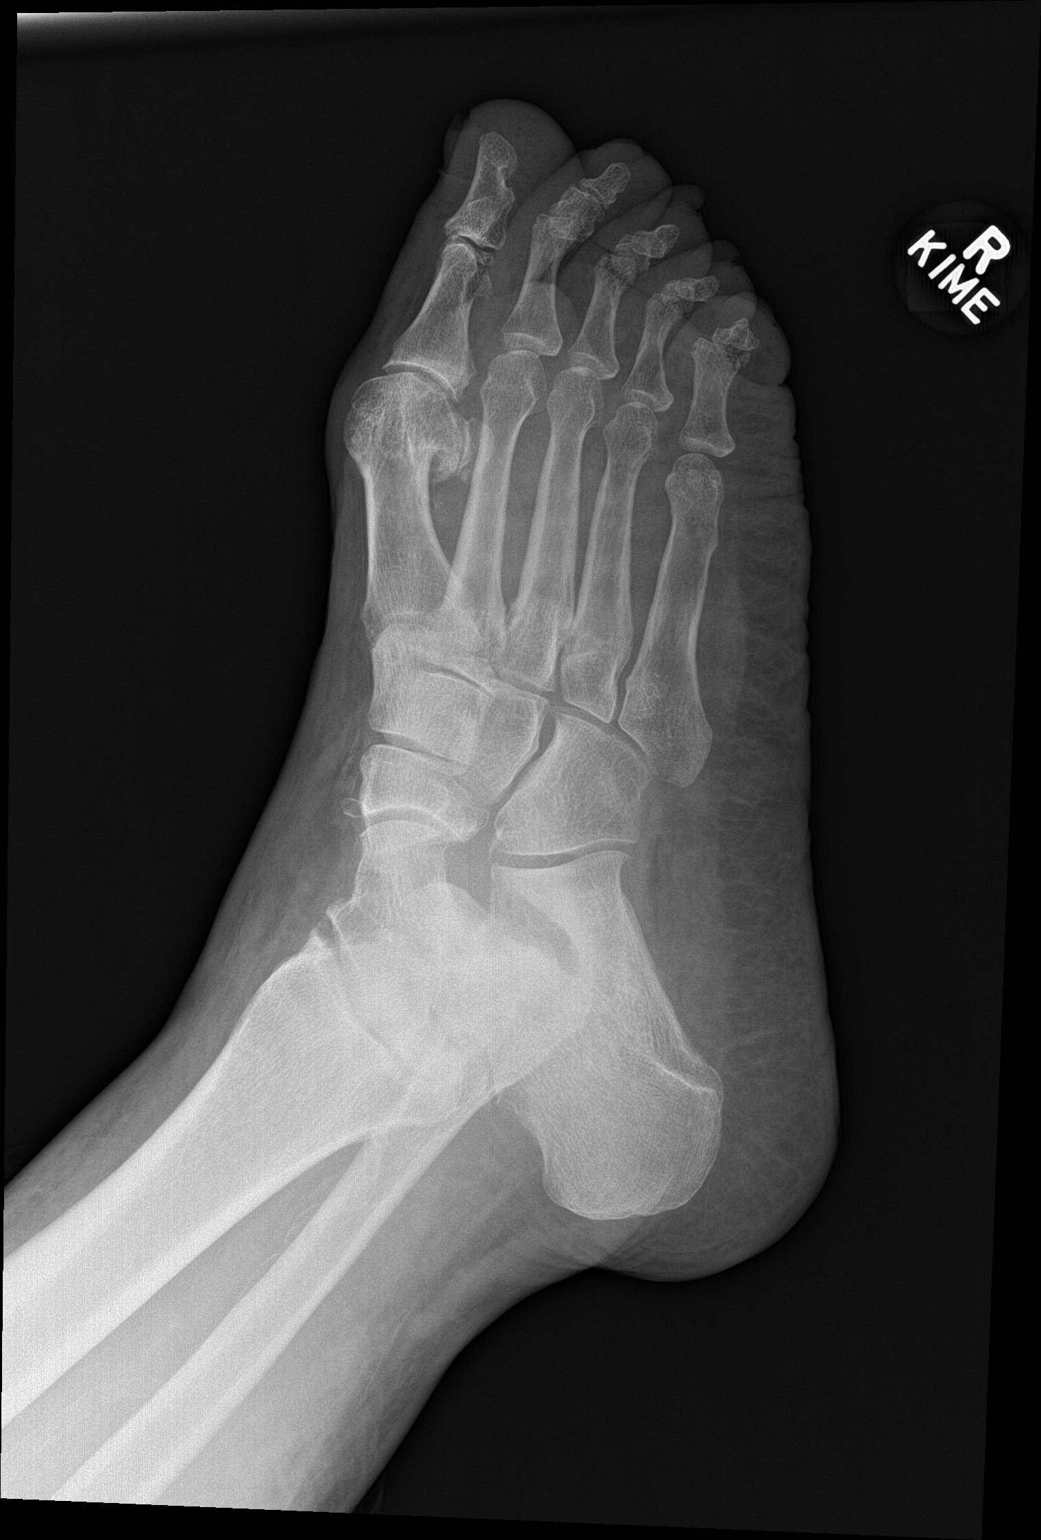

[foot lat]
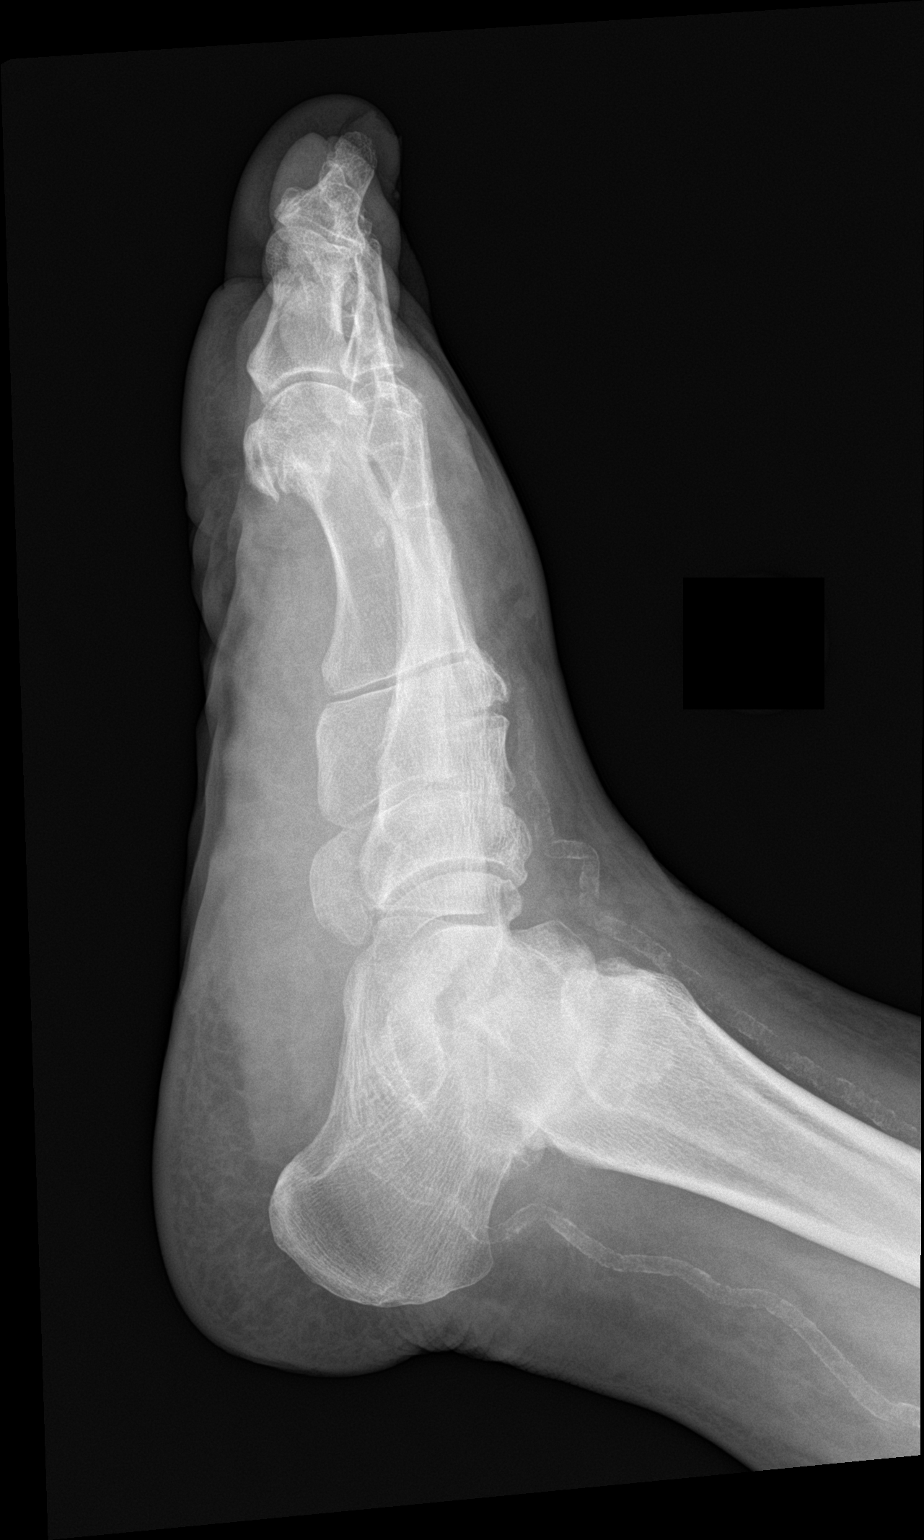

[3 of 3 positions shown; findings below may reference images not displayed]

FINDINGS: No acute fracture or dislocation is identified. Mild-to-moderate
marginal spurring is noted at the first MTP joint with hypertrophy
of the first metatarsal head and hallux valgus deformity. There is
moderate soft tissue swelling in the forefoot. Extensive
atherosclerotic vascular calcification is noted.
IMPRESSION: Soft tissue swelling without acute osseous abnormality identified.

## 2021-01-08 ENCOUNTER — Other Ambulatory Visit: Payer: Self-pay

## 2021-01-08 ENCOUNTER — Encounter: Payer: Self-pay | Admitting: Orthopaedic Surgery

## 2021-01-08 ENCOUNTER — Ambulatory Visit: Payer: Federal, State, Local not specified - PPO | Admitting: Orthopaedic Surgery

## 2021-01-08 ENCOUNTER — Ambulatory Visit: Payer: Self-pay

## 2021-01-08 DIAGNOSIS — Z96659 Presence of unspecified artificial knee joint: Secondary | ICD-10-CM | POA: Diagnosis not present

## 2021-01-08 DIAGNOSIS — M25561 Pain in right knee: Secondary | ICD-10-CM

## 2021-01-08 DIAGNOSIS — G8929 Other chronic pain: Secondary | ICD-10-CM | POA: Diagnosis not present

## 2021-01-08 DIAGNOSIS — T8484XA Pain due to internal orthopedic prosthetic devices, implants and grafts, initial encounter: Secondary | ICD-10-CM | POA: Diagnosis not present

## 2021-01-08 NOTE — Progress Notes (Signed)
Office Visit Note   Patient: Matthew Sawyer           Date of Birth: 30-Jan-1961           MRN: [id number] Visit Date: 01/08/2021              Requested by: No referring provider defined for this encounter. PCP: Patient, No Pcp Per (Inactive)   Assessment & Plan: Visit Diagnoses:  1. Chronic pain of right knee   2. Pain due to total knee replacement, initial encounter [facility])     Plan: Matthew Sawyer is approximately 5 years status post primary right total knee replacement performed elsewhere.  He notes he is frustrated because he continues to have some pain in his knee.  His x-rays look fine without evidence of any complication.  He does have some opening with a varus valgus stress and I suspect is either had some polywear or ligamentous stretch.  He is a large man.  I think it is worth trying a more supportive hinged brace and see how he does over the next 4 to 6 weeks and then have him return.  I do not think he has an infection or any evidence of loosening.  Certainly, his diabetes has something to do with his problem If no improvement would consider doing lab work and a bone scan  Follow-Up Instructions: Return if symptoms worsen or fail to improve.   Orders:  Orders Placed This Encounter  Procedures  . XR Knee 1-2 Views Right  . CBC with Differential  . C-reactive protein  . Sed Rate (ESR)   No orders of the defined types were placed in this encounter.     Procedures: No procedures performed   Clinical Data: No additional findings.   Subjective: Chief Complaint  Patient presents with  . Right Knee - Pain   Matthew Sawyer is 60 years old and relates that he is having persistent pain in his right total knee replacement.  Surgery was performed in 2017 elsewhere in [place].  He is seeking another opinion.  He does use a single-point cane and wears a pullover knee support but notes that he has pain with weightbearing and a feeling of his knee giving way.  He denies fever or  chills.  He is diabetic HPI  Review of Systems   Objective: Vital Signs: There were no vitals taken for this visit.  Physical Exam Constitutional:      Appearance: He is well-developed.  Pulmonary:     Effort: Pulmonary effort is normal.  Skin:    General: Skin is warm and dry.  Neurological:     Mental Status: He is alert and oriented to person, place, and time.  Psychiatric:        Behavior: Behavior normal.     Ortho Exam awake alert and oriented x3.  Comfortable sitting.  Weight is 257 and I am sure his BMI is well over 40.  No obvious effusion right knee.  He does open up medially with a valgus stress and laterally with a varus stress and with a positive anterior drawer sign of about 4 to 5 mm.  That is uncomfortable with that maneuver.  No particular tenderness along the medial or lateral aspect of the knee and no patella pain.  No calf pain.  He has some pitting edema in both ankles.  He is a short based gait but no pain with range of motion of either hip  Specialty Comments:  No specialty  comments available.  Imaging: XR Knee 1-2 Views Right  Result Date: 01/08/2021 Films of the right total knee replacement obtained in several projections.  There is a nice glue mantle and no obvious complications.  Very nice alignment.  No evidence of obvious loosening or fracture.  Longer stemmed tibial component in good position    PMFS History: Patient Active Problem List   Diagnosis Date Noted  . Pain due to total knee replacement, initial encounter ([place]) 01/08/2021  . Primary osteoarthritis of right knee 10/02/2015  . Hypertension 10/17/2013  . Morbid obesity ([place]) 10/17/2013   Past Medical History:  Diagnosis Date  . Asthma   . Back pain   . Capsulitis of foot   . Constipation   . Diabetes mellitus without complication ([place])   . History of hiatal hernia   . Hypertension   . Pneumonia   . Prolapsed intervertebral disk     Family History  Problem Relation Age of  Onset  . Heart disease Mother   . Heart disease Father     Past Surgical History:  Procedure Laterality Date  . CARPAL TUNNEL RELEASE Bilateral   . COLONOSCOPY    . HERNIA REPAIR     navel  . TOTAL KNEE ARTHROPLASTY Right 10/02/2015   Procedure: RIGHT TOTAL KNEE ARTHROPLASTY;  Surgeon: Melrose Nakayama, MD;  Location: [facility];  Service: Orthopedics;  Laterality: Right;   Social History   Occupational History  . Not on file  Tobacco Use  . Smoking status: Never Smoker  . Smokeless tobacco: Never Used  Vaping Use  . Vaping Use: Never used  Substance and Sexual Activity  . Alcohol use: Yes    Alcohol/week: 0.0 standard drinks    Comment: occasional  . Drug use: No  . Sexual activity: Not on file     Matthew Balding, MD   Note - This record has been created using [employer].  Chart creation errors have been sought, but may not always  have been located. Such creation errors do not reflect on  the standard of medical care.

## 2021-01-09 LAB — CBC WITH DIFFERENTIAL/PLATELET
Absolute Monocytes: 598 cells/uL (ref 200–950)
Basophils Absolute: 59 cells/uL (ref 0–200)
Basophils Relative: 1.2 %
Eosinophils Absolute: 211 cells/uL (ref 15–500)
Eosinophils Relative: 4.3 %
HCT: 45.9 % (ref 38.5–50.0)
Hemoglobin: 14.6 g/dL (ref 13.2–17.1)
Lymphs Abs: 1250 cells/uL (ref 850–3900)
MCH: 27 pg (ref 27.0–33.0)
MCHC: 31.8 g/dL — ABNORMAL LOW (ref 32.0–36.0)
MCV: 85 fL (ref 80.0–100.0)
MPV: 9.3 fL (ref 7.5–12.5)
Monocytes Relative: 12.2 %
Neutro Abs: 2783 cells/uL (ref 1500–7800)
Neutrophils Relative %: 56.8 %
Platelets: 254 10*3/uL (ref 140–400)
RBC: 5.4 10*6/uL (ref 4.20–5.80)
RDW: 13.2 % (ref 11.0–15.0)
Total Lymphocyte: 25.5 %
WBC: 4.9 10*3/uL (ref 3.8–10.8)

## 2021-01-09 LAB — SEDIMENTATION RATE: Sed Rate: 11 mm/h (ref 0–20)

## 2021-01-09 LAB — C-REACTIVE PROTEIN: CRP: 11 mg/L — ABNORMAL HIGH (ref <8.0)

## 2021-06-20 ENCOUNTER — Encounter: Payer: Self-pay | Admitting: Gastroenterology

## 2021-07-09 ENCOUNTER — Ambulatory Visit (INDEPENDENT_AMBULATORY_CARE_PROVIDER_SITE_OTHER): Payer: Federal, State, Local not specified - PPO | Admitting: Gastroenterology

## 2021-07-09 ENCOUNTER — Encounter: Payer: Self-pay | Admitting: Gastroenterology

## 2021-07-09 VITALS — BP 130/60 | HR 100 | Ht 63.0 in | Wt 251.0 lb

## 2021-07-09 DIAGNOSIS — K5904 Chronic idiopathic constipation: Secondary | ICD-10-CM | POA: Diagnosis not present

## 2021-07-09 MED ORDER — LUBIPROSTONE 24 MCG PO CAPS: 24.0000 ug | ORAL_CAPSULE | Freq: Two times a day (BID) | ORAL | 3 refills | Status: AC

## 2021-07-09 NOTE — Progress Notes (Signed)
HPI : Matthew Sawyer is a very pleasant 60 year old male with a history of arthritis status post right total knee arthroplasty who is referred to [place] by Dr. Adelene Amas for further evaluation and treatment of chronic constipation.  Patient states that he started having problems with constipation following his knee replacement in 2017.  He was taking narcotics for several months following his surgery, and originally attributed his constipation to narcotics.  However, even after he stopped taking narcotics, his constipation continued.  Prior to his knee surgery, patient states he would have a daily bowel movement.  His stools were typically soft and formed.  Currently, he is only having a bowel movement about once a week.  His stools are almost always very hard and difficult to pass.  He has significant straining with bowel movements.  Only occasionally will he be able to have a spontaneous bowel movement without medications. Currently, the patient takes Colace or senna as needed.  He has found that if he takes 5-6 Colace pills for 5-6 senna pills at night, he would typically have a bowel movement the next day.  He previously had been prescribed Linzess which she said worked well for him, but insurance would not cover it and he could not afford it.  He has also tried Metamucil for about a week, which she said worked.  He also tried taking MiraLAX daily for a few weeks, but he said this did not work either.  He was recommended to eat dates, which he said gave him significant diarrhea with urgency.  He denies any abdominal pain.  He admits that his diet does not include hardly any fruits or vegetables, but he does drink plenty of water.  His physical activity is markedly limited by his persistent knee pain. He reports having a colonoscopy several years ago performed by [facility] and believes it was normal.    Past Medical History:  Diagnosis Date   Asthma    Back pain    Capsulitis of foot    Constipation     Diabetes mellitus without complication (HCC)    History of hiatal hernia    HLD (hyperlipidemia)    Hypertension    Pneumonia    Prolapsed intervertebral disk      Past Surgical History:  Procedure Laterality Date   CARPAL TUNNEL RELEASE Bilateral    COLONOSCOPY     TOTAL KNEE ARTHROPLASTY Right 10/02/2015   Procedure: RIGHT TOTAL KNEE ARTHROPLASTY;  Surgeon: Marcene Corning, MD;  Location: [facility];  Service: Orthopedics;  Laterality: Right;   UMBILICAL HERNIA REPAIR     Family History  Problem Relation Age of Onset   Heart disease Mother    Heart failure Mother    Heart disease Father    Diabetes Father    Heart attack Father    Social History   Tobacco Use   Smoking status: Never   Smokeless tobacco: Never  Vaping Use   Vaping Use: Never used  Substance Use Topics   Alcohol use: Yes    Alcohol/week: 0.0 standard drinks    Comment: occasional   Drug use: No   Current Outpatient Medications  Medication Sig Dispense Refill   albuterol (PROVENTIL HFA;VENTOLIN HFA) 108 (90 BASE) MCG/ACT inhaler Inhale 2 puffs into the lungs every 6 (six) hours as needed for wheezing or shortness of breath.      amLODipine-benazepril (LOTREL) 5-10 MG capsule TAKE 1 CAPSULE BY MOUTH EVERY DAY 30 capsule 0   docusate sodium (COLACE)  100 MG capsule Take 5-6 mg by mouth at bedtime.     metFORMIN (GLUCOPHAGE) 500 MG tablet Take by mouth daily with breakfast.     montelukast (SINGULAIR) 10 MG tablet Take 1 tablet (10 mg total) by mouth at bedtime. 30 tablet 6   senna (SENOKOT) 8.6 MG tablet Take 6-7 tablets by mouth at bedtime. Alternating with colace what ever is on hand     No current facility-administered medications for this visit.   No Known Allergies   Review of Systems: All systems reviewed and negative except where noted in HPI.    No results found.  Physical Exam: BP 130/60 (BP Location: Left Arm, Patient Position: Sitting, Cuff Size: Normal)   Pulse 100   Ht 5\' 3"  (1.6 m)    Wt 251 lb (113.9 kg)   BMI 44.46 kg/m  Constitutional: Pleasant,obese African American male in no acute distress. Uses cane for ambulation.  Able to climb on exam table with moderate difficulty. HEENT: Normocephalic and atraumatic. Conjunctivae are normal. No scleral icterus. Cardiovascular: Normal rate, regular rhythm.  Pulmonary/chest: Effort normal and breath sounds normal. No wheezing, rales or rhonchi. Abdominal: Obese, soft, nondistended, nontender. Bowel sounds active throughout. There are no masses palpable. No hepatomegaly. Extremities: no edema Neurological: Alert and oriented to person place and time. Skin: Skin is warm and dry. No rashes noted. Psychiatric: Normal mood and affect. Behavior is normal.  CBC    Component Value Date/Time   WBC 4.9 01/08/2021 1416   RBC 5.40 01/08/2021 1416   HGB 14.6 01/08/2021 1416   HCT 45.9 01/08/2021 1416   PLT 254 01/08/2021 1416   MCV 85.0 01/08/2021 1416   MCH 27.0 01/08/2021 1416   MCHC 31.8 (L) 01/08/2021 1416   RDW 13.2 01/08/2021 1416   LYMPHSABS 1,250 01/08/2021 1416   MONOABS 0.5 09/20/2015 1502   EOSABS 211 01/08/2021 1416   BASOSABS 59 01/08/2021 1416    CMP     Component Value Date/Time   NA 140 10/17/2016 0911   K 4.7 10/17/2016 0911   CL 100 10/17/2016 0911   CO2 25 10/17/2016 0911   GLUCOSE 120 (H) 10/17/2016 0911   GLUCOSE 93 09/20/2015 1502   BUN 12 10/17/2016 0911   CREATININE 1.05 10/17/2016 0911   CALCIUM 9.6 10/17/2016 0911   PROT 7.0 10/17/2016 0911   ALBUMIN 4.3 10/17/2016 0911   AST 19 10/17/2016 0911   ALT 19 10/17/2016 0911   ALKPHOS 99 10/17/2016 0911   BILITOT 0.4 10/17/2016 0911   GFRNONAA 80 10/17/2016 0911   GFRAA 92 10/17/2016 0911     ASSESSMENT AND PLAN: 60 year old male with chronic constipation started following his knee surgery 5 years ago chronic use, but has persisted many years following cessation.  He has tried multiple different agents including Colace, senna, Metamucil and  Linzess.  Linzess worked well for him, but insurance did not cover it, and he could not afford it, so he stopped using it.   We will try Amitiza to see if this works as well to see if his insurance will cover.  If his insurance does not cover Amitiza, I would recommend escalating doses of osmotic and bulk laxatives.  Suggest he take MiraLAX 3-4 times a day and Metamucil at least once a day, with as needed senna.  As amlodipine can also worsen or cause constipation, I recommended he discuss with his primary provider to consider switching to a different antihypertensive. We will try to obtain his colonoscopy records from  [facility] to see exactly when he will be due for his next colonoscopy  Chronic idiopathic constipation - Amitiza 24 mcg PO BID - Consider switching off amlodipine  Colon cancer screening - Obtain records from [facility]  Noela Brothers E. Tomasa Rand, MD [facility]  No ref. provider found

## 2021-07-09 NOTE — Patient Instructions (Signed)
If you are age 59 or older, your body mass index should be between 23-30. Your Body mass index is 44.46 kg/m. If this is out of the aforementioned range listed, please consider follow up with your Primary Care Provider.  If you are age 50 or younger, your body mass index should be between 19-25. Your Body mass index is 44.46 kg/m. If this is out of the aformentioned range listed, please consider follow up with your Primary Care Provider.   We have sent the following medications to your pharmacy for you to pick up at your convenience: Amitiza 24 mcg twice daily.  The [facility] providers would like to encourage you to use [facility] to communicate with providers for non-urgent requests or questions.  Due to long hold times on the telephone, sending your provider a message by [facility] may be a faster and more efficient way to get a response.  Please allow 48 business hours for a response.  Please remember that this is for non-urgent requests.   It was a pleasure to see you today!  Thank you for trusting me with your gastrointestinal care!    Scott E.Tomasa Rand, MD

## 2021-07-10 ENCOUNTER — Telehealth: Payer: Self-pay | Admitting: Gastroenterology

## 2021-07-10 NOTE — Telephone Encounter (Signed)
Patient called and stated that proscription that was proscribed to him yesterday, insurance will not cover and is need of a different medication. Please advise.

## 2021-07-11 NOTE — Telephone Encounter (Signed)
Prior Authorization for Amitiza 24 mcg was done and approved. Patient and pharmacy were informed that prior authorization was approved. Olegario Messier from [facility] [place] will call patient when medication was ready for pick up.

## 2022-04-15 ENCOUNTER — Telehealth: Payer: Self-pay | Admitting: Orthopaedic Surgery

## 2022-04-15 NOTE — Telephone Encounter (Signed)
Pt called requesting if he can switch to see Dr. August Saucer about right knee pains. Please call pt or advise. Pt phone is [phone].

## 2022-04-16 NOTE — Telephone Encounter (Signed)
IC appt scheduled for 2nd opinion

## 2022-05-01 ENCOUNTER — Encounter: Payer: Self-pay | Admitting: Orthopedic Surgery

## 2022-05-01 ENCOUNTER — Ambulatory Visit: Payer: Federal, State, Local not specified - PPO | Admitting: Orthopedic Surgery

## 2022-05-01 ENCOUNTER — Ambulatory Visit (INDEPENDENT_AMBULATORY_CARE_PROVIDER_SITE_OTHER): Payer: Federal, State, Local not specified - PPO

## 2022-05-01 DIAGNOSIS — R29898 Other symptoms and signs involving the musculoskeletal system: Secondary | ICD-10-CM

## 2022-05-01 DIAGNOSIS — M25561 Pain in right knee: Secondary | ICD-10-CM

## 2022-05-01 NOTE — Progress Notes (Signed)
Office Visit Note   Patient: Matthew Sawyer           Date of Birth: 1961-07-30           MRN: [id number] Visit Date: 05/01/2022 Requested by: Percell Belt, DO [address] [place],  [place] [address] PCP: Percell Belt, DO  Subjective: Chief Complaint  Patient presents with   Right Knee - Pain    HPI: Matthew Sawyer is a 61 y.o. male who presents to the office complaining of right knee pain and difficulty with ambulation.  Patient had right total knee replacement in 2017 in [place].  This was a [employer] knee cemented.  Did well for a year and then started reporting pain and difficulty with ambulation.  Patient retired in 2018 and he has gone downhill since that time.  He has been back to see his original surgeon several times after he started falling and was told something was coming from his back.  In 2019 the falls increased and the pain worsened.  He saw Dr. Durward Fortes in 2020 who told him it was an issue with his patella.  He actually has to wear knee pads on his knees due to falling.  Reports difficulty with ambulation as well as difficulty with ADLs.  Denies any fevers or chills.  He states that he has done a lot of research regarding this knee replacement and he states that the statute of limitations has expired on the class action lawsuit regarding this total knee replacement from [employer].  I did tell him that I do not use this particular type of knee replacement.  He states he has to take baby steps when he walks.  Does not really take anything for pain.  He has had MRI of his lumbar spine which by report shows some foraminal stenosis which is moderate at L5-S1 as well as at L4-5.  No real significant spinal stenosis present.  Patient also had EMG nerve study which by report showed possible right and left mild sciatic nerve injuries.  Radiographs of the hip did not show much in the way of arthritis at that time.  He states he has chronic weakness in the left leg but denies ever  having had a stroke..                ROS: All systems reviewed are negative as they relate to the chief complaint within the history of present illness.  Patient denies fevers or chills.  Assessment & Plan: Visit Diagnoses:  1. Right knee pain, unspecified chronicity   2. Bilateral leg weakness     Plan: Impression is right knee pain with excellent strength in the right leg with good stability of the right knee replacement and no clinical or radiographic features consistent with either infection/loosening/or instability.  He does have some hip flexor weakness on the left as well as recurrent falls.  This is a difficult situation for the patient.  In general I think it is likely that the right knee is not really the culprit for his ambulatory difficulties.  He has excellent quad and hamstring strength in the right leg.  I think it is a little unclear how to reconcile the nerve conduction study which does sciatic nerve problems with the absence of significant compression in the lumbar spine.  Is possible that he could have something else going on.  Plan for the knee is CBC differential sed rate C-reactive protein to evaluate for infection along with bone  scan of the right knee to evaluate for loosening and CT scan of the right knee to evaluate any type of prosthetic bone interface problem.  Regarding his ambulatory difficulties I think T-spine MRI and C-spine MRI is indicated to rule out any type of atypical nerve compression which could be giving him proximal lower leg weakness that is not visible on the plain MRI.  No real myelopathic symptoms today in terms of hyperreflexia or clonus.  Nonetheless those studies are indicated based on the change she is having in his ambulatory ability.  Also would like to refer him to neurology for further work-up of this problem.  Follow-up after those studies on the knee.  Follow-Up Instructions: No follow-ups on file.   Orders:  Orders Placed This Encounter   Procedures   XR KNEE 3 VIEW RIGHT   NM Bone Scan 3 Phase Lower Extremity   CT KNEE RIGHT WO CONTRAST   MR Thoracic Spine w/o contrast   MR Cervical Spine w/o contrast   CBC with Differential   Sed Rate (ESR)   C-reactive protein   Ambulatory referral to Neurology   No orders of the defined types were placed in this encounter.     Procedures: No procedures performed   Clinical Data: No additional findings.  Objective: Vital Signs: There were no vitals taken for this visit.  Physical Exam:  Constitutional: Patient appears well-developed HEENT:  Head: Normocephalic Eyes:EOM are normal Neck: Normal range of motion Cardiovascular: Normal rate Pulmonary/chest: Effort normal Neurologic: Patient is alert Skin: Skin is warm Psychiatric: Patient has normal mood and affect  Ortho Exam: Ortho exam demonstrates on the right leg no knee effusion no warmth.  He has excellent range of motion of the right knee from 0 to about 115.  Collaterals are stable to varus valgus stress at 0 30 and 90 degrees and the patella tracks well throughout the range of motion.  No groin pain on either side with internal/external rotation of either leg.  Quad hamstring strength on the right is 5+ out of 5 ankle dorsiflexion plantarflexion strength on the right 5+ out of 5.  Hip flexion strength 5+ out of 5.  On the left he has 5- out of 5 quad strength 5- out of 5 hamstring strength 3 out of 5 hip flexor strength and 5 out of 5 ankle dorsiflexion and plantarflexion strength although that is a little bit more difficult to assess because of significant left ankle arthritis.  Pedal pulses are palpable.  Negative clonus no hyperreflexia in the lower extremities.  Specialty Comments:  No specialty comments available.  Imaging: No results found.   PMFS History: Patient Active Problem List   Diagnosis Date Noted   Pain due to total knee replacement, initial encounter ([place]) 01/08/2021   Primary osteoarthritis  of right knee 10/02/2015   Hypertension 10/17/2013   Morbid obesity ([place]) 10/17/2013   Past Medical History:  Diagnosis Date   Asthma    Back pain    Capsulitis of foot    Constipation    Diabetes mellitus without complication (HCC)    History of hiatal hernia    HLD (hyperlipidemia)    Hypertension    Pneumonia    Prolapsed intervertebral disk     Family History  Problem Relation Age of Onset   Heart disease Mother    Heart failure Mother    Heart disease Father    Diabetes Father    Heart attack Father     Past Surgical  History:  Procedure Laterality Date   CARPAL TUNNEL RELEASE Bilateral    COLONOSCOPY     TOTAL KNEE ARTHROPLASTY Right 10/02/2015   Procedure: RIGHT TOTAL KNEE ARTHROPLASTY;  Surgeon: Melrose Nakayama, MD;  Location: [place];  Service: Orthopedics;  Laterality: Right;   UMBILICAL HERNIA REPAIR     Social History   Occupational History   Occupation: retired  Tobacco Use   Smoking status: Never   Smokeless tobacco: Never  Vaping Use   Vaping Use: Never used  Substance and Sexual Activity   Alcohol use: Yes    Alcohol/week: 0.0 standard drinks of alcohol    Comment: occasional   Drug use: No   Sexual activity: Not on file

## 2022-05-02 LAB — C-REACTIVE PROTEIN: CRP: 10.4 mg/L — ABNORMAL HIGH (ref <8.0)

## 2022-05-02 LAB — CBC WITH DIFFERENTIAL/PLATELET
Absolute Monocytes: 510 cells/uL (ref 200–950)
Basophils Absolute: 42 cells/uL (ref 0–200)
Basophils Relative: 0.8 %
Eosinophils Absolute: 291 cells/uL (ref 15–500)
Eosinophils Relative: 5.6 %
HCT: 46 % (ref 38.5–50.0)
Hemoglobin: 15 g/dL (ref 13.2–17.1)
Lymphs Abs: 889 cells/uL (ref 850–3900)
MCH: 28.3 pg (ref 27.0–33.0)
MCHC: 32.6 g/dL (ref 32.0–36.0)
MCV: 86.8 fL (ref 80.0–100.0)
MPV: 9.5 fL (ref 7.5–12.5)
Monocytes Relative: 9.8 %
Neutro Abs: 3468 cells/uL (ref 1500–7800)
Neutrophils Relative %: 66.7 %
Platelets: 221 10*3/uL (ref 140–400)
RBC: 5.3 10*6/uL (ref 4.20–5.80)
RDW: 13.3 % (ref 11.0–15.0)
Total Lymphocyte: 17.1 %
WBC: 5.2 10*3/uL (ref 3.8–10.8)

## 2022-05-02 LAB — SEDIMENTATION RATE: Sed Rate: 11 mm/h (ref 0–20)
